# Patient Record
Sex: Male | Born: 1960 | Race: White | Hispanic: No | Marital: Married | Smoking: Never smoker
Health system: Southern US, Community
[De-identification: ages and names within clinical notes are randomized; demographics above are authoritative.]

## PROBLEM LIST (undated history)

## (undated) DIAGNOSIS — F419 Anxiety disorder, unspecified: Secondary | ICD-10-CM

## (undated) DIAGNOSIS — M549 Dorsalgia, unspecified: Secondary | ICD-10-CM

## (undated) DIAGNOSIS — M1990 Unspecified osteoarthritis, unspecified site: Secondary | ICD-10-CM

## (undated) DIAGNOSIS — T148XXA Other injury of unspecified body region, initial encounter: Secondary | ICD-10-CM

## (undated) DIAGNOSIS — G8929 Other chronic pain: Secondary | ICD-10-CM

## (undated) HISTORY — PX: KNEE ARTHROSCOPY W/ DEBRIDEMENT: SHX1867

---

## 1998-05-07 ENCOUNTER — Emergency Department (HOSPITAL_COMMUNITY): Admission: EM | Admit: 1998-05-07 | Discharge: 1998-05-07 | Payer: Self-pay | Admitting: Emergency Medicine

## 2002-04-06 ENCOUNTER — Encounter: Payer: Self-pay | Admitting: Emergency Medicine

## 2002-04-06 ENCOUNTER — Emergency Department (HOSPITAL_COMMUNITY): Admission: EM | Admit: 2002-04-06 | Discharge: 2002-04-06 | Payer: Self-pay | Admitting: Emergency Medicine

## 2003-12-15 ENCOUNTER — Emergency Department (HOSPITAL_COMMUNITY): Admission: EM | Admit: 2003-12-15 | Discharge: 2003-12-15 | Payer: Self-pay | Admitting: Emergency Medicine

## 2004-01-07 ENCOUNTER — Encounter: Admission: RE | Admit: 2004-01-07 | Discharge: 2004-01-07 | Payer: Self-pay | Admitting: Orthopedic Surgery

## 2004-01-16 ENCOUNTER — Encounter: Admission: RE | Admit: 2004-01-16 | Discharge: 2004-01-16 | Payer: Self-pay | Admitting: Sports Medicine

## 2004-01-18 ENCOUNTER — Emergency Department (HOSPITAL_COMMUNITY): Admission: EM | Admit: 2004-01-18 | Discharge: 2004-01-18 | Payer: Self-pay | Admitting: Family Medicine

## 2004-01-24 ENCOUNTER — Inpatient Hospital Stay (HOSPITAL_COMMUNITY): Admission: EM | Admit: 2004-01-24 | Discharge: 2004-01-26 | Payer: Self-pay | Admitting: Psychiatry

## 2004-01-24 ENCOUNTER — Ambulatory Visit: Payer: Self-pay | Admitting: Psychiatry

## 2004-02-09 ENCOUNTER — Emergency Department (HOSPITAL_COMMUNITY): Admission: EM | Admit: 2004-02-09 | Discharge: 2004-02-09 | Payer: Self-pay | Admitting: Family Medicine

## 2004-03-05 ENCOUNTER — Emergency Department (HOSPITAL_COMMUNITY): Admission: EM | Admit: 2004-03-05 | Discharge: 2004-03-06 | Payer: Self-pay | Admitting: Emergency Medicine

## 2004-03-06 ENCOUNTER — Ambulatory Visit: Payer: Self-pay | Admitting: Psychiatry

## 2004-03-06 ENCOUNTER — Inpatient Hospital Stay (HOSPITAL_COMMUNITY): Admission: EM | Admit: 2004-03-06 | Discharge: 2004-03-09 | Payer: Self-pay | Admitting: Psychiatry

## 2005-07-18 ENCOUNTER — Emergency Department (HOSPITAL_COMMUNITY): Admission: EM | Admit: 2005-07-18 | Discharge: 2005-07-18 | Payer: Self-pay | Admitting: Emergency Medicine

## 2006-01-28 ENCOUNTER — Emergency Department (HOSPITAL_COMMUNITY): Admission: EM | Admit: 2006-01-28 | Discharge: 2006-01-28 | Payer: Self-pay | Admitting: Emergency Medicine

## 2006-01-29 ENCOUNTER — Inpatient Hospital Stay (HOSPITAL_COMMUNITY): Admission: EM | Admit: 2006-01-29 | Discharge: 2006-01-31 | Payer: Self-pay | Admitting: Emergency Medicine

## 2006-01-29 ENCOUNTER — Ambulatory Visit: Payer: Self-pay | Admitting: *Deleted

## 2006-02-22 ENCOUNTER — Ambulatory Visit: Payer: Self-pay | Admitting: *Deleted

## 2006-03-03 ENCOUNTER — Emergency Department (HOSPITAL_COMMUNITY): Admission: EM | Admit: 2006-03-03 | Discharge: 2006-03-03 | Payer: Self-pay | Admitting: Emergency Medicine

## 2006-08-28 ENCOUNTER — Emergency Department (HOSPITAL_COMMUNITY): Admission: EM | Admit: 2006-08-28 | Discharge: 2006-08-28 | Payer: Self-pay | Admitting: Emergency Medicine

## 2008-07-08 ENCOUNTER — Encounter: Admission: RE | Admit: 2008-07-08 | Discharge: 2008-07-08 | Payer: Self-pay | Admitting: Family Medicine

## 2010-02-21 ENCOUNTER — Encounter: Payer: Self-pay | Admitting: Orthopedic Surgery

## 2010-02-22 ENCOUNTER — Encounter: Payer: Self-pay | Admitting: Emergency Medicine

## 2010-02-22 ENCOUNTER — Encounter: Payer: Self-pay | Admitting: Cardiovascular Disease

## 2010-06-19 NOTE — H&P (Signed)
NAME:  FALCON, MCCASKEY NO.:  [id number]   MEDICAL RECORD NO.:  [id number]          PATIENT TYPE:  INP   LOCATION:  3736                         FACILITY:  [facility]   PHYSICIAN:  Reginia Forts, MD     DATE OF BIRTH:  [birth year]   DATE OF ADMISSION:  01/29/2006  DATE OF DISCHARGE:  01/31/2006                              HISTORY & PHYSICAL   PHONE CALL NOTE   Patrick Cruz is a 50 year old Caucasian male with a history of depression,  lower extremity edema, and hypertension who calls regarding worsening  lower extremity edema and pain in his legs.  Patient notes that these  symptoms have existed since December 30 to December 31 when he was  discharged from the hospital.  He has noted improvement with Lasix and  has taken one dose today earlier.  He denies any chest pain, but notes  that he has a fair amount of pain in both knee joints associated with  the edema.  The patient has noted improvement with his one dose of  Lasix.  I advised the patient to take an extra dose and a half of Lasix  tonight with the caveat that if the symptoms continue to worsen that he  present to the nearest emergency room.  The patient understands and will  comply.  Pending the results, he will determine if further evaluation  may be required either tonight or in the morning through urgent care.      Reginia Forts, MD  Electronically Signed     RA/MEDQ  D:  02/25/2006  T:  02/26/2006  Job:  [id number]

## 2010-06-19 NOTE — Discharge Summary (Signed)
Patrick Cruz, Patrick Cruz NO.:  [id number]   MEDICAL RECORD NO.:  [id number]          PATIENT TYPE:  IPS   LOCATION:  0503                          [facility]ILITY[facility]  [facility]   PHYSICIAN:  Jasmine Pang, M.D. DATE OF BIRTH:  Jun 29, 1960   DATE OF ADMISSION:  03/06/2004  DATE OF DISCHARGE:  03/09/2004                                 DISCHARGE SUMMARY   IDENTIFICATION:  The patient is a 50 year old married Caucasian male  involuntarily committed on March 06, 2004 after an overdose.   HISTORY OF PRESENT ILLNESS:  The patient is here on petition.  Papers state  that he overdosed on Klonopin 1 mg, taking approximately 25 mg, and is  considered a danger to himself.  The patient states he only took 10 Klonopin  tablets.  He states that his stressors are that his wife drinks.  She is  always on him calling him names, making him feel worthless.  The patient  has been unemployed since his work injury.  He is hoping to return to work  soon.  He states that he is also caring for his neighbor who has cancer and  it is very upsetting to him.  He reports ongoing conflict with his wife as  indicated above. They were to have marital counseling but he states that his  wife was not willing to do it now.  This is the second psychiatric admission  for patient.  He was in our hospital in December of 2005 for depression.  He  sees Dr. Senaida Ores on an outpatient basis.  No history of suicide attempt.  The patient has no substance abuse history.  He is a nonsmoker.   PAST MEDICAL HISTORY:  No acute medical problems.  He was seen in  orthopedics due to knee pain at one time.   MEDICATIONS:  The patient is currently on Paxil 20 mg daily, Klonopin 1 mg  t.i.d. and Abilify 30 mg daily and Adderall 30 mg XL t.i.d.  He reports  compliance with his medications.   ALLERGIES:  He has no known drug allergies.   PHYSICAL EXAMINATION:  The patient was assessed at [facility] ED.  He was a  well-nourished,  well-developed, middle-aged man, very tearful.  Temperature  was 98.5, heart rate 69, respirations 22, blood pressure 129/66.  He is 6  feet tall and 240 pounds.  Blood sugar is 110.  Salicylate is less than 4.  Alcohol level is less than 5.  Urine drug screen is positive for opiates and  positive for benzodiazepines.  Pulse oximetry is 93.   LABORATORY DATA:  On admission, hemogram was within normal limits except for  a slightly elevated WBC at 10.9 (4-10.5).  Routine chemistry profile was  within normal limits except for a slightly decreased sodium at 134 (135-145)  and a slightly increased glucose at 109 (70-99).  TSH was within normal  limits.   HOSPITAL COURSE:  Upon admission, the patient was continued on his routine  medications.  This included Paxil 20 mg daily, Klonopin 0.5 mg p.o. b.i.d.  and 0.5 mg q.h.s.  Abilify was decreased to 15 mg daily.  He was given  hydrocodone 7.5/750 mg q.6h. p.r.n. pain, Ambien 10 mg q.h.s. p.r.n. was  ordered.  On March 07, 2004, his Paxil was increased to 40 mg q.d.  The  patient was cooperative and able to participate appropriately in the  hospital milieu.  He was able to get along well with peers and staff.  He  attended groups and appeared motivated to make some changes.  At the time of  discharge, the patient's mental status exam had improved markedly.  He was  less depressed.  He was not suicidal, not homicidal.  There was no  psychosis.  There was no psychosis or delusions.  Thoughts were logical and  goal directed.  The cognitive was grossly within normal limits.   DISCHARGE DIAGNOSES:   AXIS I:  Depressive disorder not otherwise specified.   AXIS II:  None.   AXIS III:  Knee pain.   AXIS IV:  Problems with primary support group, occupation, economics, other  psychosocial problems, medical problems.   AXIS V:  Current Global Assessment of Functioning 50; admission Global  Assessment of Functioning 30; estimated highest past year  65.   DISCHARGE MEDICATIONS:  1.  Paxil 40 mg daily.  2.  Abilify 15 mg daily.  3.  Klonopin 0.5 mg at 9 a.m. and 6 p.m. and 0.5 mg at bedtime.  4.  Hydrocodone 7.5/750 mg, take as directed per primary care M.D.   DIET:  There were no specific dietary limitations.   ACTIVITY:  Will be determined by his orthopedic doctor.   POST-HOSPITAL CARE PLANS:  The patient will see Abel Presto on February 14th  at 2 p.m.  He will return to see Dr. Leone Haven on February 16th at  [facility].      BHS/MEDQ  D:  04/16/2004  T:  04/17/2004  Job:  [id number]

## 2010-06-19 NOTE — Assessment & Plan Note (Signed)
[facility]                            CARDIOLOGY OFFICE NOTE   NAME:Patrick Cruz, Patrick Cruz                      MRN:          [id number]  DATE:02/22/2006                            DOB:          May 23, 1960    This is a 50 year old married white male patient who was in the hospital  from January 29, 2006 throughout January 31, 2006 with lower extremity  edema and hypertension as well as bronchitis.  He was given  antihypertensive medication and diuresed.  He was scheduled for an  outpatient Myoview and 2-D echo, but these were not done because his  wife was being evaluated for oral cancer and he had to cancel.  He  called today because of a 11 pound weight gain overnight, increase pain  in his lower extremities and high blood pressure.  He is quite anxious  in the office today.  His blood pressure is elevated.  He denies any  significant shortness of breath, dyspnea on excerption, orthopnea or  paroxysmal nocturnal dyspnea or chest pain.  He denies any excess of  salt intake, his wife does all the cooking, but he claims they are very  careful as far as salt, watching the salt.   CURRENT MEDICATIONS:  1. Lisinopril 10 mg daily.  2. Paxil 40 mg daily.  3. Klonopin 1 mg t.i.d.   PHYSICAL EXAMINATION:  This is an anxious 50 year old white male in no  acute distress.  Blood pressure 174/95, pulse 92, weight 259.  NECK:  Without JVD, HJR, bruit or thyroid enlargement.  LUNGS:  Clear anterior, posterior and lateral.  HEART:  Regular rate and rhythm at 90 beats per minute.  Normal S1 and  S2.  No murmur, rub, bruit, thrill or heave noted.  ABDOMEN:  Soft without organomegaly, masses or lesions or abnormal  tenderness.  EXTREMITIES:  Plus 2 edema bilaterally.  Decreased but present distal  pulses.   EKG normal sinus rhythm, no acute change.   IMPRESSION:  1. Hypertension, uncontrolled.  2. Edema with 11 pound weight gain per patient, 19 pound weight  gain      on our scales from hospital weight.  3. History of depression.  4. Bronchitis.   PLAN:  At this time I will increase his lisinopril to 20 mg a day and  add Lasix 40 mg daily in hopes of controlling his blood pressure and  writhing him of some of his edema.  We have scheduled a 2-D echo and  lower extremity Doppler to be done today as well as a BMET and D-dimer.  We have also scheduled him for a stress Myoview to be done since he  could not do this when it was scheduled for January 8.  He will see Dr.  Eden Cruz in follow up next week.   ADDENDUM  [name] was seen as add on today for lower extremity edema and at  least 11 pound weight gain.  I had scheduled him for blood work as well  as lower extremity Dopplers and 2D echo to be done today in our office,  and the  patient walked out without having any of this done.  Just stated  he was going home and left.  We are not sure why he did this.  I have  tried calling his home to ask him to return to have these tests  performed, but there is no answer.  He had a stress Myoview scheduled  once before that he did not show for.      Patrick Reedy, PA-C  Electronically Signed      Patrick Cranker, MD, [facility]  Electronically Signed   ML/MedQ  DD: 02/22/2006  DT: 02/22/2006  Job #: [id number]

## 2010-06-19 NOTE — H&P (Signed)
NAMEKEENEN, ROESSNER               ACCOUNT NO.:  [id number]   MEDICAL RECORD NO.:  [id number]          PATIENT TYPE:  INP   LOCATION:  3736                         FACILITY:  [facility]   PHYSICIAN:  Gerrit Friends. Dietrich Pates, MD, FACCDATE OF BIRTH:  06/09/1960   DATE OF ADMISSION:  01/29/2006  DATE OF DISCHARGE:                              HISTORY & PHYSICAL   CHIEF COMPLAINT:  Lower extremity swelling and chest pain for 4 days.   HISTORY OF PRESENT ILLNESS:  This is a 50 year old gentleman with a  history of depression and hypertension who states for the past 4 days he  has had increasing lower extremity edema along with intermittent chest  pain with exertion.  He has never had these symptoms in the past.  He  states that his chest pain is epigastric in nature and typically worse  with exertion; however, he does not endorse this strongly.  He denies  any shortness of breath, nausea, or vomiting with this chest discomfort  but does state that it radiates to his left arm and becomes profoundly  diaphoretic.  He states that it lasts for about 2 or 3 minutes and then  resolves spontaneously.  Many times he does not even have to lay down to  relax for it to resolve on its own.  He states that he has had some  minimal dyspnea on exertion for the last 4 days; however, he is more  limited in his functional capacity by leg tenderness secondary to  worsening lower extremity edema.  He states that he has not had any  orthopnea or PND.  He does not endorse abdominal swelling, and he has  never had symptoms like this before.  He denies any flu-like symptoms,  recent travel, and he states that his urine output has been normal over  the preceding period of time.  He states that currently he does have  some issues with depression given that his wife is still being treated  for oral cancer in [place].   PAST MEDICAL HISTORY:  1. Hypertension for a number of years, currently not on any      medications.  2. Depression for 2-3 years, a history of an overdose in February of      2006.  3. Status post left knee arthroscopy.   ALLERGIES:  NONE.   CURRENT MEDICATIONS:  1. Paxil 40 mg daily, although he is not sure of the dose 100%.  2. Klonopin 1 mg t.i.d.   SOCIAL HISTORY:  He lives in [place] with his wife.  He is a  Lobbyist.  He has 2 kids.  He denies any tobacco, alcohol,  or drug abuse.   FAMILY HISTORY:  Is significant for a father who is alive at age 31.  He  underwent a CABG when he was 53.  He has 3 sisters with no coronary  disease.   REVIEW OF SYSTEMS:  Is as above in the HPI, otherwise negative.   PHYSICAL EXAM:  VITAL SIGNS:  Temperature of 98.1, pulse of 78,  respiratory rate of 18, blood pressure of 131/78,  O2 saturation is 95%  on 2 L nasal cannula.  GENERAL:  He is in no acute distress.  HEENT EXAM:  Normocephalic, atraumatic.  Pupils equally round and  reactive to light.  Oropharynx is moist and clear.  His sclerae are  clear.  NECK:  Supple with no lymphadenopathy, 2+ carotid impulses, symmetrical  bilaterally.  He has no carotid bruits.  His JVP is to his ears and  somewhat obscured by his large neck.  CARDIOVASCULAR EXAM:  Regular rate and rhythm, normal S1, S2.  I cannot  hear an S3 or an S4 gallop.  There is no murmurs or rubs.  LUNGS:  Clear to auscultation bilaterally without any wheezes, rhonchi,  or rales.  ABDOMINAL EXAM:  Positive obesity, positive bowel sounds, soft,  nontender, nondistended, without a palpable liver or spleen.  EXTREMITY EXAM:  2+ radial and posterior tibials, pulses symmetric  bilaterally.  He has 2+ pretibial edema to his patellae, symmetric  bilaterally.  It might be slightly worse on the left.  There is some  overlying hemosiderin changes noted on the anterior aspect of both of  his legs.  They are tender to palpation.  SKIN:  He has no rashes or lesions otherwise on his body.  NEUROLOGIC EXAM:  Alert and  oriented x3.  His affect is somewhat flat.  Cranial nerves III through XII are intact.  He has 5/5 muscle strength  throughout.   LABORATORY DATA:  Chest x-ray is unremarkable without any effusions or  edema.  His cardiac silhouette looks unremarkable.  EKG shows a normal  sinus rhythm and a rate of 95 with a normal axis, normal intervals  without any ST or T wave changes or a Q wave.  White count is 5.7,  hematocrit of 35, platelets 277, creatinine of 1, potassium of 4.1,  bicarb of 22.  CK-MB is slightly elevated at 8.6, troponin-I of less  than 0.5, D-dimer is negative at less than 0.22.   ASSESSMENT:  1. Unstable angina.  Patient has already been started on aspirin and      heparin in the emergency department given his mildly elevated CK-MB      and negative troponin.  2. New-onset heart failure of unclear etiology.  3. Depression.   PLAN:  Patient will be admitted to Telemetry.  He will be ruled out for  an acute MI by continuous cycling of cardiac enzymes.  I am unclear what  the etiology of his heart failure symptoms are given that on exam he  does not appear to be hypertensive and his EKG does not have voltage  suggestive of long-standing uncontrolled hypertension.  We will check  PFTs as well as an echo in the morning.  If his EF is truly depressed,  it would be reasonable to look for secondary causes such as coronary  artery disease and other non-ischemic etiologies of his heart failure.  If he has any focal wall motion abnormalities or continued cardiac  enzyme abnormalities, it would be reasonable to proceed with a  catheterization just to rule out the possibility of occult coronary  artery disease causing these symptoms.  I have started him on a low-dose  ACE inhibitor and have held beta blocker.  Given his decompensated heart  failure symptoms, I have started him on his Lasix IV once daily.  This can be increased in frequency if he is not adequately diuresing.  At   this point in time, I have continued antidepressants.  I  have also  continued him on aspirin and heparin at this point in time, and, once  again, as mentioned before, we will check an echo in the morning.     ______________________________  Eston Esters. Sherryll Burger, MD      Gerrit Friends. Dietrich Pates, MD, [facility]  Electronically Signed    BRS/MEDQ  D:  01/29/2006  T:  01/30/2006  Job:  [id number]

## 2010-06-19 NOTE — H&P (Signed)
[name], [name] NO.:  [id number]   MEDICAL RECORD NO.:  [id number]          PATIENT TYPE:  IPS   LOCATION:  0503                          [facility]ILITY[facility]  [facility]   PHYSICIAN:  Jeanice Lim, M.D. DATE OF BIRTH:  12-04-1960   DATE OF ADMISSION:  03/06/2004  DATE OF DISCHARGE:                         PSYCHIATRIC ADMISSION ASSESSMENT   IDENTIFYING INFORMATION:  This is a 50 year old married white male,  involuntarily committed on March 06, 2004.   HISTORY OF PRESENT ILLNESS:  The patient is here on petition.  Papers state  that the patient overdosed on Klonopin 1 mg, taking approximately 25 mg and  is considered a danger to himself.  The patient states he only took 10  Klonopin tablets.  He states that his stressors are that his wife drinks.  She is always on him, calling him names, making him feel not very  worthless.  The patient has been unemployed since his work injury in October  2005.  He is hoping to return to work soon.  He states he is also caring for  his neighbor who has cancer and it is very upsetting to him and he does not  know what to do any more.  He reports ongoing conflict with the wife.  They  were to have marital counseling but wife, he states. Is not willing to do  that at this time.   PAST PSYCHIATRIC HISTORY:  Second admission.  The patient was here in  December 2005 for depression.  Sees Dr. Senaida Ores as an outpatient.  No  history of suicide attempt.   SOCIAL HISTORY:  He is a 50 year old married white male, married for 23  years, first marriage, has 2 children ages 74 and 65.  Lives with his wife  and children.  Unemployed due to knee surgery in October 2005.  He is an  Journalist, newspaper.  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   FAMILY HISTORY:  None.   ALCOHOL DRUG HISTORY:  Non smoker, denies any alcohol or drug use.   PAST MEDICAL HISTORY:  The patient was seen in orthopedics, possibly Dr.  Carlene Coria.  Medical problems are knee pain.   MEDICATIONS:   The patient has been on Paxil 20 mg daily, Klonopin t.i.d.,  Abilify 30 mg daily, Adderall 30 mg XL t.i.d.  The patient reports  compliance with his medications.   DRUG ALLERGIES:  No known allergies.   PHYSICAL EXAMINATION:  The patient was assessed at [facility]  He is a well-  nourished, well-developed middle-aged male, very tearful.  Temperature is  98.5, 69 heart rate, 22 respirations, blood pressure is 129/66.  Six feet  tall, 240 pounds.  Blood sugar is 110, salicylate is less than 4, alcohol  level is less than 5.  Urine drug screen is positive for opiates, positive  for benzodiazepines.  Pulse oximetry is 93.   MENTAL STATUS EXAM:  Alert, middle-aged, cooperative male, little eye  contact.  Speech is clear.  The patient feels hopeless and helpless.  Affect  is tearful throughout the interview, very despondent, looking down most of  the time.  Thought  processes are coherent, there is no evidence of  psychosis, cognition function is intact.  Memory is good, judgment and  insight is fair.   ADMISSION DIAGNOSES:   AXIS I:  Depressive disorder not otherwise specified.   AXIS II:  Deferred.   AXIS III:  Knee pain.   AXIS IV:  Problems with primary support group, occupation, economics, other  psychosocial problems, medical problems.   AXIS V:  Current is 30, estimated this past year is 43.   PLAN:  Stabilize mood and thinking, contract for safety.  We will hold the  patient's Adderall for now as that may be adding to the patient's mood  instability.  We will also decrease Klonopin.  We will resume his Paxil.  We  will have a family session with wife, consider family therapy, and Alanon.   TENTATIVE LENGTH OF CARE:  4-5 days.      JO/MEDQ  D:  03/06/2004  T:  03/06/2004  Job:  [id number]

## 2010-06-19 NOTE — Discharge Summary (Signed)
NAMEOBERT, Patrick Cruz               ACCOUNT NO.:  [id number]   MEDICAL RECORD NO.:  [id number]          PATIENT TYPE:  INP   LOCATION:  3736                         FACILITY:  [facility]   PHYSICIAN:  Noralyn Pick. Eden Emms, MD, FACCDATE OF BIRTH:  1960-07-23   DATE OF ADMISSION:  01/29/2006  DATE OF DISCHARGE:  01/31/2006                               DISCHARGE SUMMARY   PRIMARY CARDIOLOGIST:  Theron Arista C. Eden Emms, MD, [facility].   PRIMARY CARE PHYSICIAN:  None listed.   PRIMARY DIAGNOSIS:  Unstable angina.   SECONDARY DIAGNOSES:  1. Bronchitis.  2. Depression.  3. History of lower extremity edema.  4. History of hypertension.   PROCEDURES PERFORMED DURING HOSPITALIZATION:  Echocardiogram.   HISTORY OF PRESENT ILLNESS:  This is a 50 year old Caucasian male with  no prior cardiac history who was admitted through the emergency room  with complaints of lower extremity edema x4 days with negative orthopnea  or PND.  He also had some epigastric chest pain without associated  nausea, vomiting or diaphoresis.  He has had some mild dyspnea on  exertion for the last four days with swelling in his lower extremities.  He was admitted for further evaluation to rule out CHF and rule out ACS.   HOSPITAL COURSE:  The patient was admitted, diuresed, although BNP was  only 92.  The patient was afebrile and chest x-ray revealed right upper  lobe atelectasis or infiltrate with followup CT of the chest revealing  patchy, predominantly upper lobe ground glass air space opacity for  which the differential diagnosis is broad and includes alveolar filling  process such as blood, pus, fluids, cells and protein.  Clinical  correlation recommended.  The patient's EKG revealed normal sinus rhythm  with nonspecific T-wave abnormality and the patient was observed further  throughout hospitalization with serial enzymes.  The patient's troponins  were negative x3 during hospitalization.  The patient's dyspnea did  decrease  with improvement in breathing status after use of Lasix.  There  was no evidence for PE.  The patient's white blood cells were also not  elevated during hospitalization and he was afebrile.  The patient had no  further chest discomfort during hospitalization.  He was seen and  examined on the day of discharge by Dr. Eden Emms and deemed stable for  discharge.   LABS ON DISCHARGE:  Sodium 139, potassium 3.9, chloride 102, CO2 27,  glucose 120, BUN 11, creatinine 1, d-dimer less than 0.22, hemoglobin  14.5, hematocrit 42.8, white blood cells 13.3, platelets 319.   VITAL SIGNS ON DISCHARGE:  Blood pressure 162/92, pulse 83, respirations  22, temperature 98.  The patient weighed 240.2 pounds.   FOLLOWUP APPOINTMENTS AND PLANS:  1. The patient is scheduled for an outpatient Myoview stress test on      January 8 at 10 a.m. at [facility].  2. The patient is scheduled for followup appointment with Dr. Eden Emms      on January 17 at 10:45 a.m.  3. The patient has been advised on smoking cessation.   DISCHARGE MEDICATIONS:  1. Avalox 400 mg once a  day x5 days.  2. Lisinopril 10 mg once a day.  3. Percocet 1-2 tablets as needed p.r.n. pain.  4. Paxil 40 mg daily.  5. Klonopin 1 mg three times a day.   ALLERGIES:  NO KNOWN DRUG ALLERGIES.   TIME SPENT WITH PATIENT:  Including physician time:  30 minutes.      Bettey Mare. Lyman Bishop, NP      Noralyn Pick. Eden Emms, MD, [facility]  Electronically Signed    KML/MEDQ  D:  01/31/2006  T:  02/01/2006  Job:  [id number]
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
## 2018-01-10 ENCOUNTER — Emergency Department (HOSPITAL_COMMUNITY): Payer: Medicare Other

## 2018-01-10 ENCOUNTER — Encounter (HOSPITAL_COMMUNITY): Payer: Self-pay | Admitting: *Deleted

## 2018-01-10 ENCOUNTER — Emergency Department (HOSPITAL_COMMUNITY)
Admission: EM | Admit: 2018-01-10 | Discharge: 2018-01-10 | Disposition: A | Discharge status: Home / Self Care | Payer: Medicare Other | Attending: Emergency Medicine | Admitting: Emergency Medicine

## 2018-01-10 DIAGNOSIS — I10 Essential (primary) hypertension: Secondary | ICD-10-CM | POA: Insufficient documentation

## 2018-01-10 DIAGNOSIS — M545 Low back pain, unspecified: Secondary | ICD-10-CM

## 2018-01-10 HISTORY — DX: Other chronic pain: G89.29

## 2018-01-10 HISTORY — DX: Unspecified osteoarthritis, unspecified site: M19.90

## 2018-01-10 HISTORY — DX: Dorsalgia, unspecified: M54.9

## 2018-01-10 LAB — I-STAT CHEM 8, ED
BUN: 15 mg/dL (ref 6–20)
Calcium, Ion: 1.16 mmol/L (ref 1.15–1.40)
Chloride: 102 mmol/L (ref 98–111)
Creatinine, Ser: 1.1 mg/dL (ref 0.61–1.24)
Glucose, Bld: 124 mg/dL — ABNORMAL HIGH (ref 70–99)
HCT: 39 % (ref 39.0–52.0)
Hemoglobin: 13.3 g/dL (ref 13.0–17.0)
Potassium: 4.1 mmol/L (ref 3.5–5.1)
Sodium: 137 mmol/L (ref 135–145)
TCO2: 27 mmol/L (ref 22–32)

## 2018-01-10 MED ORDER — OXYCODONE-ACETAMINOPHEN 5-325 MG PO TABS
1.0000 | ORAL_TABLET | Freq: Once | ORAL | Status: AC
  Administered 2018-01-10: 1 via ORAL
  Filled 2018-01-10: qty 1

## 2018-01-10 MED ORDER — HYDROCHLOROTHIAZIDE 12.5 MG PO TABS: 12.5000 mg | ORAL_TABLET | Freq: Every day | ORAL | 0 refills | Status: DC

## 2018-01-10 NOTE — ED Notes (Signed)
Patient transported to X-ray 

## 2018-01-10 NOTE — ED Notes (Signed)
Patient verbalizes understanding of discharge instructions. Opportunity for questioning and answers were provided. Armband removed by staff, pt discharged from ED ambulatory to home.  

## 2018-01-10 NOTE — ED Triage Notes (Signed)
Pt in c/o lower back pain that radiates down his right leg, has been going on for years but got worse last night

## 2018-01-10 NOTE — ED Provider Notes (Signed)
[place] EMERGENCY DEPARTMENT Provider Note   CSN: [id number] Arrival date & time: 01/10/18  1134   History   Chief Complaint Chief Complaint  Patient presents with  . Back Pain    HPI Patrick Cruz is a 57 y.o. male.   HPI    57 year old male presents today with complaints of low back pain.  Patient notes history of chronic low back pain that waxes and wanes.  He has been evaluated several times for back pain and lower extremity swelling.  Notes that today's pain is similar to previous pain and is experiencing a flare.  Denies any abdominal pain or changes in distal neurovascular status.  Patient does note he has chronic decreased sensation through his bilateral lower legs but this is unchanged.  He denies any significant weakness in his lower extremities denies any change in bowel or bladder control.  Patient denies any fever.  No trauma to his low back.  Patient has not tried any medications for this.  He notes he has used tramadol in the past.   Past Medical History:  Diagnosis Date  . Arthritis   . Chronic back pain     There are no active problems to display for this patient.   History reviewed. No pertinent surgical history.      Home Medications    Prior to Admission medications   Medication Sig Start Date End Date Taking? Authorizing Provider  hydrochlorothiazide (HYDRODIURIL) 12.5 MG tablet Take 1 tablet (12.5 mg total) by mouth daily. 01/10/18   Okey Regal, PA-C    Family History History reviewed. No pertinent family history.  Social History Social History   Tobacco Use  . Smoking status: Never Smoker  . Smokeless tobacco: Never Used  Substance Use Topics  . Alcohol use: Not on file  . Drug use: Not on file     Allergies   Patient has no known allergies.   Review of Systems Review of Systems  All other systems reviewed and are negative.   Physical Exam Updated Vital Signs BP (!) 181/94 (BP Location: Right  Arm)   Pulse 98   Temp 98 F (36.7 C) (Oral)   Resp 20   SpO2 100%   Physical Exam  Constitutional: He is oriented to person, place, and time. He appears well-developed and well-nourished.  HENT:  Head: Normocephalic and atraumatic.  Eyes: Pupils are equal, round, and reactive to light. Conjunctivae are normal. Right eye exhibits no discharge. Left eye exhibits no discharge. No scleral icterus.  Neck: Normal range of motion. No JVD present. No tracheal deviation present.  Pulmonary/Chest: Effort normal. No stridor.  Musculoskeletal:  1+ pitting edema in the bilateral lower extremities-decreased sensation throughout bilateral lower extremities closely, bilateral strength 5 out of 5  No cervical or thoracic midline tenderness, minor tenderness palpation of the lower lumbar and surrounding musculature  Neurological: He is alert and oriented to person, place, and time. Coordination normal.  Psychiatric: He has a normal mood and affect. His behavior is normal. Judgment and thought content normal.  Nursing note and vitals reviewed.    ED Treatments / Results  Labs (all labs ordered are listed, but only abnormal results are displayed) Labs Reviewed  I-STAT CHEM 8, ED - Abnormal; Notable for the following components:      Result Value   Glucose, Bld 124 (*)    All other components within normal limits    EKG None  Radiology Dg Lumbar Spine Complete  Result  Date: 01/10/2018 CLINICAL DATA:  Central low back pain radiating down both legs EXAM: LUMBAR SPINE - COMPLETE 4+ VIEW COMPARISON:  08/28/2006 FINDINGS: L5 probable pars defects.  There is chronic grade 2 anterolisthesis. Advanced degenerative disc narrowing with vacuum phenomenon at L4-5 and L5-S1, new/progressed. L3 superior endplate fracture with depression, favored chronic given sclerosis, spurring, and lack of a discrete fracture line. Levoscoliosis and generalized right-sided endplate spurring. IMPRESSION: 1. Advanced  degenerative disease with levoscoliosis that has developed from 2008. 2. L5 pars defects with chronic grade 1 anterolisthesis at L5-S1. 3. L3 superior endplate fracture that is favored chronic. Electronically Signed   By: Monte Fantasia M.D.   On: 01/10/2018 13:13    Procedures Procedures (including critical care time)  Medications Ordered in ED Medications  oxyCODONE-acetaminophen (PERCOCET/ROXICET) 5-325 MG per tablet 1 tablet (1 tablet Oral Given 01/10/18 1226)     Initial Impression / Assessment and Plan / ED Course  I have reviewed the triage vital signs and the nursing notes.  Pertinent labs & imaging results that were available during my care of the patient were reviewed by me and considered in my medical decision making (see chart for details).     Labs: I-STAT Chem-8  Imaging: DG lumbar spine complete  Consults:  Therapeutics: Percocet  Discharge Meds: Hydrochlorothiazide  Assessment/Plan: 57 year old male presents today with chronic back pain.  I believe this is acute on chronic.  He was given pain medicine here that did not improve his symptoms.  Patient would not like any medication for home he is anticipating following up with pain management specialist which I find reasonable in this situation.  Patient has no red flags or need for further evaluation or management in the setting.  He was hypertensive here, he notes that normally his blood pressure runs in the 120s, he denies any complaints related to this.  Patient will be discharged with blood pressure medication, he will monitor there is at home and follow-up with primary care if blood pressure remains elevated.  He is given strict return precautions, he verbalized understanding and agreement to today's plan.      Final Clinical Impressions(s) / ED Diagnoses   Final diagnoses:  Hypertension, unspecified type  Low back pain without sciatica, unspecified back pain laterality, unspecified chronicity    ED  Discharge Orders         Ordered    hydrochlorothiazide (HYDRODIURIL) 12.5 MG tablet  Daily     01/10/18 1403           Okey Regal, PA-C 01/10/18 1404    Daleen Bo, MD 01/11/18 2118

## 2018-01-10 NOTE — Discharge Instructions (Addendum)
Please read attached information. If you experience any new or worsening signs or symptoms please return to the emergency room for evaluation. Please follow-up with your primary care provider  and pain management specialist as discussed.

## 2018-01-11 ENCOUNTER — Emergency Department (HOSPITAL_COMMUNITY)
Admission: EM | Admit: 2018-01-11 | Discharge: 2018-01-12 | Disposition: A | Discharge status: Home / Self Care | Payer: Medicare Other | Attending: Emergency Medicine | Admitting: Emergency Medicine

## 2018-01-11 ENCOUNTER — Other Ambulatory Visit: Payer: Self-pay

## 2018-01-11 ENCOUNTER — Encounter (HOSPITAL_COMMUNITY): Payer: Self-pay | Admitting: Emergency Medicine

## 2018-01-11 DIAGNOSIS — M5441 Lumbago with sciatica, right side: Secondary | ICD-10-CM | POA: Diagnosis not present

## 2018-01-11 DIAGNOSIS — M545 Low back pain: Secondary | ICD-10-CM | POA: Diagnosis present

## 2018-01-11 DIAGNOSIS — M5442 Lumbago with sciatica, left side: Secondary | ICD-10-CM | POA: Diagnosis not present

## 2018-01-11 NOTE — ED Triage Notes (Signed)
Pt c/o lower back pain that radiates down his right leg, seen yesterday for same. Hx chronic back pain.

## 2018-01-12 ENCOUNTER — Emergency Department (HOSPITAL_COMMUNITY): Payer: Medicare Other

## 2018-01-12 LAB — BASIC METABOLIC PANEL
Anion gap: 11 (ref 5–15)
BUN: 19 mg/dL (ref 6–20)
CO2: 25 mmol/L (ref 22–32)
Calcium: 8.9 mg/dL (ref 8.9–10.3)
Chloride: 101 mmol/L (ref 98–111)
Creatinine, Ser: 1.38 mg/dL — ABNORMAL HIGH (ref 0.61–1.24)
GFR calc Af Amer: >60 mL/min (ref >60)
GFR calc non Af Amer: 56 mL/min — ABNORMAL LOW (ref >60)
Glucose, Bld: 107 mg/dL — ABNORMAL HIGH (ref 70–99)
Potassium: 3.9 mmol/L (ref 3.5–5.1)
Sodium: 137 mmol/L (ref 135–145)

## 2018-01-12 LAB — CBC
HCT: 37.4 % — ABNORMAL LOW (ref 39.0–52.0)
Hemoglobin: 11.6 g/dL — ABNORMAL LOW (ref 13.0–17.0)
MCH: 27.1 pg (ref 26.0–34.0)
MCHC: 31 g/dL (ref 30.0–36.0)
MCV: 87.4 fL (ref 80.0–100.0)
Platelets: 321 10*3/uL (ref 150–400)
RBC: 4.28 MIL/uL (ref 4.22–5.81)
RDW: 13.1 % (ref 11.5–15.5)
WBC: 13.1 10*3/uL — ABNORMAL HIGH (ref 4.0–10.5)
nRBC: 0 % (ref 0.0–0.2)

## 2018-01-12 MED ORDER — IBUPROFEN 800 MG PO TABS: 800.0000 mg | ORAL_TABLET | Freq: Three times a day (TID) | ORAL | 0 refills | Status: DC | PRN

## 2018-01-12 MED ORDER — METHYLPREDNISOLONE SODIUM SUCC 125 MG IJ SOLR
INTRAMUSCULAR | Status: AC
  Administered 2018-01-12: 125 mg
  Filled 2018-01-12: qty 2

## 2018-01-12 MED ORDER — ONDANSETRON 4 MG PO TBDP: 4.0000 mg | ORAL_TABLET | Freq: Four times a day (QID) | ORAL | 0 refills | Status: DC | PRN

## 2018-01-12 MED ORDER — PREDNISONE 10 MG (21) PO TBPK: ORAL_TABLET | ORAL | 0 refills | Status: DC

## 2018-01-12 MED ORDER — OXYCODONE-ACETAMINOPHEN 5-325 MG PO TABS: 2.0000 | ORAL_TABLET | Freq: Four times a day (QID) | ORAL | 0 refills | Status: DC | PRN

## 2018-01-12 MED ORDER — ONDANSETRON HCL 4 MG/2ML IJ SOLN
INTRAMUSCULAR | Status: AC
  Administered 2018-01-12: 4 mg
  Filled 2018-01-12: qty 2

## 2018-01-12 MED ORDER — HYDROMORPHONE HCL 1 MG/ML IJ SOLN
INTRAMUSCULAR | Status: AC
  Administered 2018-01-12: 1 mg
  Filled 2018-01-12: qty 1

## 2018-01-12 MED ORDER — HYDROMORPHONE HCL 1 MG/ML IJ SOLN
1.0000 mg | Freq: Once | INTRAMUSCULAR | Status: AC
Start: 2018-01-12 — End: 2018-01-12
  Administered 2018-01-12: 1 mg via INTRAVENOUS
  Filled 2018-01-12: qty 1

## 2018-01-12 NOTE — ED Provider Notes (Addendum)
CHIEF COMPLAINT: Back pain  HPI: Patient is a 57 year old male who with history of chronic back pain who presents to the emergency department with an exacerbation of his chronic pain.  Describes it as pain in the lower back that radiates down both legs worse on the right side.  He has had numbness in both legs for several months and feels like both of his legs are weak but is able to ambulate.  He tells me that he is having urinary incontinence for the past couple of weeks.  No rectal incontinence.  No fever.  No history of cancer, HIV, diabetes.  No history of previous back surgery or injections.  Was just seen here in the emergency department for the same and had unremarkable x-rays.  States symptoms have been worse since yesterday and that is why he came back to the emergency department.  Does not have pain medication at home.  Has not tried over-the-counter Tylenol or ibuprofen.  ROS: See HPI Constitutional: no fever  Eyes: no drainage  ENT: no runny nose   Cardiovascular:  no chest pain  Resp: no SOB  GI: no vomiting GU: no dysuria Integumentary: no rash  Allergy: no hives  Musculoskeletal: no leg swelling  Neurological: no slurred speech ROS otherwise negative  PAST MEDICAL HISTORY/PAST SURGICAL HISTORY:  Past Medical History:  Diagnosis Date  . Arthritis   . Chronic back pain     MEDICATIONS:  Prior to Admission medications   Medication Sig Start Date End Date Taking? Authorizing Provider  hydrochlorothiazide (HYDRODIURIL) 12.5 MG tablet Take 1 tablet (12.5 mg total) by mouth daily. 01/10/18   Okey Regal, PA-C    ALLERGIES:  No Known Allergies  SOCIAL HISTORY:  Social History   Tobacco Use  . Smoking status: Never Smoker  . Smokeless tobacco: Never Used  Substance Use Topics  . Alcohol use: Not on file    FAMILY HISTORY: No family history on file.  EXAM: BP 106/63 (BP Location: Right Arm)   Pulse 72   Temp 98 F (36.7 C) (Oral)   Resp 18   SpO2 100%   CONSTITUTIONAL: Alert and oriented and responds appropriately to questions. Well-appearing; well-nourished HEAD: Normocephalic EYES: Conjunctivae clear, pupils appear equal, EOMI ENT: normal nose; moist mucous membranes NECK: Supple, no meningismus, no nuchal rigidity, no LAD  CARD: RRR; S1 and S2 appreciated; no murmurs, no clicks, no rubs, no gallops RESP: Normal chest excursion without splinting or tachypnea; breath sounds clear and equal bilaterally; no wheezes, no rhonchi, no rales, no hypoxia or respiratory distress, speaking full sentences ABD/GI: Normal bowel sounds; non-distended; soft, non-tender, no rebound, no guarding, no peritoneal signs, no hepatosplenomegaly BACK:  The back appears normal and is under over the lower lumbar spine without step-off or deformity, no redness or warmth, no ecchymosis or swelling, there is no CVA tenderness EXT: Normal ROM in all joints; non-tender to palpation; no edema; normal capillary refill; no cyanosis, no calf tenderness or swelling    SKIN: Normal color for age and race; warm; no rash NEURO: Moves all extremities equally, diminished strength in bilateral lower extremities, normal strength in bilateral upper extremities, diminished reflexes in bilateral lower extremities, reports diminished sensation in bilateral lower extremities with normal sensation in his upper extremities and face, normal speech, cranial nerves II through XII intact, able to ambulate PSYCH: The patient's mood and manner are appropriate. Grooming and personal hygiene are appropriate.  MEDICAL DECISION MAKING: Patient here with exacerbation of his chronic pain.  Now complaining of urinary incontinence.  Will obtain MRI of the lumbar spine without contrast to rule out cauda equina.  Will treat symptomatically with Dilaudid, Solu-Medrol.  No fevers to suggest epidural abscess, discitis or osteomyelitis.  He is able to ambulate.  I have witnessed him ambulating in the ED.  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  No signs of cauda equina.  Suspect the foraminal stenosis is likely the cause of most of his pain, numbness in both legs and radicular symptoms.  Will discharge with a steroid taper, Percocet, ibuprofen, Zofran.  Recommended outpatient follow-up.  Does not need emergent neurosurgical evaluation.    At this time, I do not feel there is any life-threatening condition present. I have reviewed and discussed all results (EKG, imaging, lab, urine as appropriate) and exam findings with patient/family. I have reviewed nursing notes and appropriate previous records.  I feel the patient is safe to be discharged home without further emergent workup and can continue workup as an outpatient as needed. Discussed usual and customary return precautions. Patient/family verbalize understanding and are comfortable with this plan.  Outpatient follow-up has been provided as needed. All questions have been answered.      Bush Murdoch, Delice Bison, DO 01/12/18 0618    Hulda Reddix, Delice Bison, DO 01/12/18 [id number]

## 2018-01-12 NOTE — ED Notes (Signed)
PT states understanding of care given, follow up care, and medication prescribed. PT ambulated from ED to car with a steady gait. 

## 2018-01-12 NOTE — Discharge Instructions (Addendum)
Your MRI did not show any emergent pathology today.  You need to follow-up with your outpatient primary care physician for your chronic pain.   Steps to find a Primary Care Provider (PCP):  Call [phone] or [phone] to access "[facility]."  2.  You may also go on the [facility] website at [email or web address]  3.  [facility] also frequently accepts new patients.  Patrick Cruz [phone]  4.  There are also multiple [facility], [facility] and [facility] practices throughout the [place] that are frequently accepting new patients. You may find a clinic that is close to your home and contact them.  [email or web address] [phone]  [facility] [email or web address]  [email or web address] [place] [email or web address] [phone]  5.  Local Health Departments also can provide primary care services.  [facility]  [place] [address] [phone]  [facility] [place] [address] [place] [facility] [place] [phone]

## 2018-02-21 ENCOUNTER — Ambulatory Visit: Payer: Medicare Other | Admitting: Nurse Practitioner

## 2018-05-08 DIAGNOSIS — M5442 Lumbago with sciatica, left side: Secondary | ICD-10-CM | POA: Diagnosis not present

## 2018-05-08 DIAGNOSIS — Z136 Encounter for screening for cardiovascular disorders: Secondary | ICD-10-CM | POA: Diagnosis not present

## 2018-05-08 DIAGNOSIS — M5441 Lumbago with sciatica, right side: Secondary | ICD-10-CM | POA: Diagnosis not present

## 2018-05-08 DIAGNOSIS — M5136 Other intervertebral disc degeneration, lumbar region: Secondary | ICD-10-CM | POA: Diagnosis not present

## 2018-05-08 DIAGNOSIS — G8929 Other chronic pain: Secondary | ICD-10-CM | POA: Diagnosis not present

## 2018-05-08 DIAGNOSIS — Z13220 Encounter for screening for lipoid disorders: Secondary | ICD-10-CM | POA: Diagnosis not present

## 2018-05-08 DIAGNOSIS — I10 Essential (primary) hypertension: Secondary | ICD-10-CM | POA: Diagnosis not present

## 2018-05-12 DIAGNOSIS — R69 Illness, unspecified: Secondary | ICD-10-CM | POA: Diagnosis not present

## 2018-06-05 DIAGNOSIS — R69 Illness, unspecified: Secondary | ICD-10-CM | POA: Diagnosis not present

## 2018-06-06 DIAGNOSIS — M4856XG Collapsed vertebra, not elsewhere classified, lumbar region, subsequent encounter for fracture with delayed healing: Secondary | ICD-10-CM | POA: Diagnosis not present

## 2018-06-06 DIAGNOSIS — F1120 Opioid dependence, uncomplicated: Secondary | ICD-10-CM | POA: Diagnosis not present

## 2018-06-06 DIAGNOSIS — M545 Low back pain: Secondary | ICD-10-CM | POA: Diagnosis not present

## 2018-06-06 DIAGNOSIS — G8929 Other chronic pain: Secondary | ICD-10-CM | POA: Diagnosis not present

## 2018-06-06 DIAGNOSIS — Z79891 Long term (current) use of opiate analgesic: Secondary | ICD-10-CM | POA: Diagnosis not present

## 2018-06-06 DIAGNOSIS — M4306 Spondylolysis, lumbar region: Secondary | ICD-10-CM | POA: Diagnosis not present

## 2018-06-06 DIAGNOSIS — G894 Chronic pain syndrome: Secondary | ICD-10-CM | POA: Diagnosis not present

## 2018-06-08 ENCOUNTER — Other Ambulatory Visit: Payer: Self-pay

## 2018-06-08 ENCOUNTER — Inpatient Hospital Stay (HOSPITAL_COMMUNITY)
Admission: EM | Admit: 2018-06-08 | Discharge: 2018-06-11 | DRG: 897 | Disposition: A | Discharge status: Home / Self Care | Payer: Medicare HMO | Attending: Internal Medicine | Admitting: Internal Medicine

## 2018-06-08 ENCOUNTER — Encounter (HOSPITAL_COMMUNITY): Payer: Self-pay | Admitting: Emergency Medicine

## 2018-06-08 DIAGNOSIS — M4807 Spinal stenosis, lumbosacral region: Secondary | ICD-10-CM | POA: Diagnosis not present

## 2018-06-08 DIAGNOSIS — F1123 Opioid dependence with withdrawal: Secondary | ICD-10-CM | POA: Diagnosis not present

## 2018-06-08 DIAGNOSIS — M4726 Other spondylosis with radiculopathy, lumbar region: Secondary | ICD-10-CM | POA: Diagnosis present

## 2018-06-08 DIAGNOSIS — I160 Hypertensive urgency: Secondary | ICD-10-CM

## 2018-06-08 DIAGNOSIS — G8929 Other chronic pain: Secondary | ICD-10-CM | POA: Diagnosis not present

## 2018-06-08 DIAGNOSIS — D649 Anemia, unspecified: Secondary | ICD-10-CM | POA: Diagnosis not present

## 2018-06-08 DIAGNOSIS — Z20828 Contact with and (suspected) exposure to other viral communicable diseases: Secondary | ICD-10-CM | POA: Diagnosis not present

## 2018-06-08 DIAGNOSIS — M4317 Spondylolisthesis, lumbosacral region: Secondary | ICD-10-CM | POA: Diagnosis not present

## 2018-06-08 DIAGNOSIS — M1990 Unspecified osteoarthritis, unspecified site: Secondary | ICD-10-CM | POA: Diagnosis present

## 2018-06-08 DIAGNOSIS — F1120 Opioid dependence, uncomplicated: Secondary | ICD-10-CM | POA: Diagnosis not present

## 2018-06-08 DIAGNOSIS — Z79891 Long term (current) use of opiate analgesic: Secondary | ICD-10-CM | POA: Diagnosis not present

## 2018-06-08 DIAGNOSIS — I4581 Long QT syndrome: Secondary | ICD-10-CM | POA: Diagnosis not present

## 2018-06-08 DIAGNOSIS — N179 Acute kidney failure, unspecified: Secondary | ICD-10-CM | POA: Diagnosis not present

## 2018-06-08 DIAGNOSIS — F1193 Opioid use, unspecified with withdrawal: Secondary | ICD-10-CM

## 2018-06-08 DIAGNOSIS — R69 Illness, unspecified: Secondary | ICD-10-CM | POA: Diagnosis not present

## 2018-06-08 DIAGNOSIS — R945 Abnormal results of liver function studies: Secondary | ICD-10-CM | POA: Diagnosis not present

## 2018-06-08 DIAGNOSIS — G894 Chronic pain syndrome: Secondary | ICD-10-CM | POA: Diagnosis not present

## 2018-06-08 DIAGNOSIS — Z03818 Encounter for observation for suspected exposure to other biological agents ruled out: Secondary | ICD-10-CM | POA: Diagnosis not present

## 2018-06-08 DIAGNOSIS — I10 Essential (primary) hypertension: Secondary | ICD-10-CM | POA: Diagnosis present

## 2018-06-08 DIAGNOSIS — M5416 Radiculopathy, lumbar region: Secondary | ICD-10-CM | POA: Diagnosis present

## 2018-06-08 LAB — RAPID URINE DRUG SCREEN, HOSP PERFORMED
Amphetamines: NOT DETECTED
Barbiturates: NOT DETECTED
Benzodiazepines: NOT DETECTED
Cocaine: NOT DETECTED
Opiates: NOT DETECTED
Tetrahydrocannabinol: NOT DETECTED

## 2018-06-08 LAB — CBC WITH DIFFERENTIAL/PLATELET
Abs Immature Granulocytes: 0.1 10*3/uL — ABNORMAL HIGH (ref 0.00–0.07)
Basophils Absolute: 0.1 10*3/uL (ref 0.0–0.1)
Basophils Relative: 0 %
Eosinophils Absolute: 0.1 10*3/uL (ref 0.0–0.5)
Eosinophils Relative: 1 %
HCT: 39.6 % (ref 39.0–52.0)
Hemoglobin: 12.3 g/dL — ABNORMAL LOW (ref 13.0–17.0)
Immature Granulocytes: 1 %
Lymphocytes Relative: 29 %
Lymphs Abs: 3.3 10*3/uL (ref 0.7–4.0)
MCH: 26.7 pg (ref 26.0–34.0)
MCHC: 31.1 g/dL (ref 30.0–36.0)
MCV: 86.1 fL (ref 80.0–100.0)
Monocytes Absolute: 0.7 10*3/uL (ref 0.1–1.0)
Monocytes Relative: 6 %
Neutro Abs: 7.1 10*3/uL (ref 1.7–7.7)
Neutrophils Relative %: 63 %
Platelets: 372 10*3/uL (ref 150–400)
RBC: 4.6 MIL/uL (ref 4.22–5.81)
RDW: 13.9 % (ref 11.5–15.5)
WBC: 11.3 10*3/uL — ABNORMAL HIGH (ref 4.0–10.5)
nRBC: 0 % (ref 0.0–0.2)

## 2018-06-08 LAB — COMPREHENSIVE METABOLIC PANEL
ALT: 84 U/L — ABNORMAL HIGH (ref 0–44)
AST: 125 U/L — ABNORMAL HIGH (ref 15–41)
Albumin: 4 g/dL (ref 3.5–5.0)
Alkaline Phosphatase: 205 U/L — ABNORMAL HIGH (ref 38–126)
Anion gap: 12 (ref 5–15)
BUN: 24 mg/dL — ABNORMAL HIGH (ref 6–20)
CO2: 24 mmol/L (ref 22–32)
Calcium: 9.1 mg/dL (ref 8.9–10.3)
Chloride: 99 mmol/L (ref 98–111)
Creatinine, Ser: 1.14 mg/dL (ref 0.61–1.24)
GFR calc Af Amer: >60 mL/min (ref >60)
GFR calc non Af Amer: >60 mL/min (ref >60)
Glucose, Bld: 97 mg/dL (ref 70–99)
Potassium: 4 mmol/L (ref 3.5–5.1)
Sodium: 135 mmol/L (ref 135–145)
Total Bilirubin: 0.8 mg/dL (ref 0.3–1.2)
Total Protein: 7.9 g/dL (ref 6.5–8.1)

## 2018-06-08 LAB — CBG MONITORING, ED: Glucose-Capillary: 92 mg/dL (ref 70–99)

## 2018-06-08 LAB — SARS CORONAVIRUS 2 BY RT PCR (HOSPITAL ORDER, PERFORMED IN ~~LOC~~ HOSPITAL LAB): SARS Coronavirus 2: NEGATIVE

## 2018-06-08 MED ORDER — DICYCLOMINE HCL 10 MG PO CAPS
20.0000 mg | ORAL_CAPSULE | Freq: Four times a day (QID) | ORAL | Status: DC | PRN
  Administered 2018-06-08: 19:00:00 20 mg via ORAL
  Filled 2018-06-08 (×2): qty 2

## 2018-06-08 MED ORDER — CLONIDINE HCL 0.1 MG PO TABS: 0.1000 mg | ORAL_TABLET | Freq: Every day | ORAL | Status: DC

## 2018-06-08 MED ORDER — KETOROLAC TROMETHAMINE 30 MG/ML IJ SOLN
30.0000 mg | Freq: Three times a day (TID) | INTRAMUSCULAR | Status: AC
  Administered 2018-06-08 – 2018-06-09 (×2): 30 mg via INTRAVENOUS
  Filled 2018-06-08 (×2): qty 1

## 2018-06-08 MED ORDER — HYDROCHLOROTHIAZIDE 25 MG PO TABS
12.5000 mg | ORAL_TABLET | Freq: Every day | ORAL | Status: DC
  Administered 2018-06-09: 11:00:00 12.5 mg via ORAL
  Filled 2018-06-08: qty 1

## 2018-06-08 MED ORDER — GABAPENTIN 300 MG PO CAPS
300.0000 mg | ORAL_CAPSULE | Freq: Three times a day (TID) | ORAL | Status: DC
  Administered 2018-06-09 – 2018-06-11 (×8): 300 mg via ORAL
  Filled 2018-06-08 (×8): qty 1

## 2018-06-08 MED ORDER — CLONIDINE HCL 0.1 MG PO TABS
0.1000 mg | ORAL_TABLET | Freq: Four times a day (QID) | ORAL | Status: DC
  Administered 2018-06-09 (×3): 0.1 mg via ORAL
  Filled 2018-06-08 (×3): qty 1

## 2018-06-08 MED ORDER — LOPERAMIDE HCL 2 MG PO CAPS
2.0000 mg | ORAL_CAPSULE | ORAL | Status: DC | PRN
  Administered 2018-06-08: 2 mg via ORAL
  Filled 2018-06-08: qty 1

## 2018-06-08 MED ORDER — LISINOPRIL 10 MG PO TABS
10.0000 mg | ORAL_TABLET | Freq: Every day | ORAL | Status: DC
  Administered 2018-06-09: 10 mg via ORAL
  Filled 2018-06-08: qty 1

## 2018-06-08 MED ORDER — SODIUM CHLORIDE 0.9 % IV SOLN
INTRAVENOUS | Status: AC
  Administered 2018-06-08: 23:00:00 via INTRAVENOUS

## 2018-06-08 MED ORDER — SODIUM CHLORIDE 0.9 % IV BOLUS
1000.0000 mL | Freq: Once | INTRAVENOUS | Status: AC
  Administered 2018-06-08: 1000 mL via INTRAVENOUS

## 2018-06-08 MED ORDER — METHOCARBAMOL 1000 MG/10ML IJ SOLN
INTRAMUSCULAR | Status: AC
  Filled 2018-06-08: qty 10

## 2018-06-08 MED ORDER — ACETAMINOPHEN 325 MG PO TABS: 650.0000 mg | ORAL_TABLET | Freq: Four times a day (QID) | ORAL | Status: DC | PRN

## 2018-06-08 MED ORDER — HYDROXYZINE HCL 25 MG PO TABS
25.0000 mg | ORAL_TABLET | Freq: Four times a day (QID) | ORAL | Status: DC | PRN
  Administered 2018-06-08 – 2018-06-10 (×3): 25 mg via ORAL
  Filled 2018-06-08 (×3): qty 1

## 2018-06-08 MED ORDER — DEXMEDETOMIDINE HCL IN NACL 200 MCG/50ML IV SOLN
0.4000 ug/kg/h | INTRAVENOUS | Status: DC
  Administered 2018-06-08: 23:00:00 0.4 ug/kg/h via INTRAVENOUS
  Administered 2018-06-08: 1.2 ug/kg/h via INTRAVENOUS
  Administered 2018-06-09: 0.4 ug/kg/h via INTRAVENOUS
  Administered 2018-06-09: 0.8 ug/kg/h via INTRAVENOUS
  Administered 2018-06-09: 1 ug/kg/h via INTRAVENOUS
  Administered 2018-06-09: 0.4 ug/kg/h via INTRAVENOUS
  Administered 2018-06-09 (×4): 1.2 ug/kg/h via INTRAVENOUS
  Filled 2018-06-08: qty 150
  Filled 2018-06-08: qty 50
  Filled 2018-06-08: qty 150
  Filled 2018-06-08: qty 50

## 2018-06-08 MED ORDER — NAPROXEN 250 MG PO TABS
500.0000 mg | ORAL_TABLET | Freq: Two times a day (BID) | ORAL | Status: DC | PRN
  Administered 2018-06-08: 500 mg via ORAL
  Filled 2018-06-08: qty 2

## 2018-06-08 MED ORDER — METHOCARBAMOL 1000 MG/10ML IJ SOLN
500.0000 mg | Freq: Once | INTRAVENOUS | Status: AC
  Administered 2018-06-08: 23:00:00 500 mg via INTRAVENOUS
  Filled 2018-06-08: qty 5

## 2018-06-08 MED ORDER — LORAZEPAM 2 MG/ML IJ SOLN
1.0000 mg | Freq: Four times a day (QID) | INTRAMUSCULAR | Status: AC
  Administered 2018-06-08 – 2018-06-09 (×3): 1 mg via INTRAVENOUS
  Filled 2018-06-08 (×3): qty 1

## 2018-06-08 MED ORDER — METHOCARBAMOL 500 MG PO TABS
500.0000 mg | ORAL_TABLET | Freq: Three times a day (TID) | ORAL | Status: DC | PRN
  Administered 2018-06-08: 19:00:00 500 mg via ORAL
  Filled 2018-06-08: qty 1

## 2018-06-08 MED ORDER — FENTANYL CITRATE (PF) 100 MCG/2ML IJ SOLN
INTRAMUSCULAR | Status: AC
  Filled 2018-06-08: qty 2

## 2018-06-08 MED ORDER — CLONIDINE HCL 0.1 MG PO TABS
0.1000 mg | ORAL_TABLET | Freq: Four times a day (QID) | ORAL | Status: DC | PRN
  Administered 2018-06-11: 01:00:00 0.1 mg via ORAL
  Filled 2018-06-08: qty 1

## 2018-06-08 MED ORDER — PAROXETINE HCL 30 MG PO TABS
30.0000 mg | ORAL_TABLET | Freq: Every day | ORAL | Status: DC
  Filled 2018-06-08 (×4): qty 1

## 2018-06-08 MED ORDER — ENOXAPARIN SODIUM 60 MG/0.6ML ~~LOC~~ SOLN
60.0000 mg | SUBCUTANEOUS | Status: DC
  Administered 2018-06-09 (×2): 60 mg via SUBCUTANEOUS
  Filled 2018-06-08 (×2): qty 0.6

## 2018-06-08 MED ORDER — METHOCARBAMOL 500 MG PO TABS
500.0000 mg | ORAL_TABLET | Freq: Four times a day (QID) | ORAL | Status: DC
  Administered 2018-06-08 – 2018-06-11 (×10): 500 mg via ORAL
  Filled 2018-06-08 (×10): qty 1

## 2018-06-08 MED ORDER — ONDANSETRON HCL 4 MG/2ML IJ SOLN
4.0000 mg | Freq: Once | INTRAMUSCULAR | Status: AC
  Administered 2018-06-08: 19:00:00 4 mg via INTRAVENOUS
  Filled 2018-06-08: qty 2

## 2018-06-08 MED ORDER — ONDANSETRON 4 MG PO TBDP: 4.0000 mg | ORAL_TABLET | Freq: Four times a day (QID) | ORAL | Status: DC | PRN

## 2018-06-08 MED ORDER — CLONIDINE HCL 0.1 MG PO TABS: 0.1000 mg | ORAL_TABLET | ORAL | Status: DC

## 2018-06-08 MED ORDER — DEXMEDETOMIDINE HCL IN NACL 200 MCG/50ML IV SOLN
INTRAVENOUS | Status: AC
  Filled 2018-06-08: qty 50

## 2018-06-08 MED ORDER — BUPRENORPHINE HCL-NALOXONE HCL 8-2 MG SL SUBL
1.0000 | SUBLINGUAL_TABLET | Freq: Once | SUBLINGUAL | Status: AC
  Administered 2018-06-08: 19:00:00 1 via SUBLINGUAL
  Filled 2018-06-08: qty 1

## 2018-06-08 MED ORDER — CLONIDINE HCL 0.1 MG PO TABS: 0.1000 mg | ORAL_TABLET | Freq: Four times a day (QID) | ORAL | Status: DC

## 2018-06-08 MED ORDER — FENTANYL CITRATE (PF) 100 MCG/2ML IJ SOLN
25.0000 ug | INTRAMUSCULAR | Status: DC | PRN
  Administered 2018-06-08: 23:00:00 50 ug via INTRAVENOUS

## 2018-06-08 MED ORDER — ACETAMINOPHEN 650 MG RE SUPP: 650.0000 mg | Freq: Four times a day (QID) | RECTAL | Status: DC | PRN

## 2018-06-08 MED ORDER — CLONIDINE HCL 0.2 MG PO TABS
0.2000 mg | ORAL_TABLET | Freq: Once | ORAL | Status: AC
  Administered 2018-06-08: 19:00:00 0.2 mg via ORAL
  Filled 2018-06-08: qty 1

## 2018-06-08 MED ORDER — LORAZEPAM 2 MG/ML IJ SOLN
1.0000 mg | Freq: Once | INTRAMUSCULAR | Status: AC
  Administered 2018-06-08: 22:00:00 1 mg via INTRAVENOUS
  Filled 2018-06-08: qty 1

## 2018-06-08 NOTE — Progress Notes (Signed)
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

## 2018-06-08 NOTE — ED Notes (Signed)
Updated patients wife on plan of care.  

## 2018-06-08 NOTE — ED Triage Notes (Signed)
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

## 2018-06-08 NOTE — H&P (Signed)
TRH H&P    Patient Demographics:    Patrick Cruz, is a 58 y.o. male  MRN: [id number]  DOB - [birth year]  Admit Date - 06/08/2018  Referring MD/NP/PA:   Sherwood Gambler  Outpatient Primary MD for the patient is Patient, No Pcp Per Jeanice Lim PA (Family Medicine) Norma Fredrickson (psychiatry) Lonia Chimera Pol (psychiatry, neurology) Joiner  Patient coming from:  home  Chief complaint- opioid withdrawal   HPI:    Patrick Cruz  is a 58 y.o. male,   w hypertension, ADD,  chronic back pain/lumbar radiculopathy apparently previously on methadone 149m po qday, stopped this 2 days ago and started suboxone this AM.  Pt has had increase in pain as well as feeling shaky and loose stool, and anxious.    Review of MRI Lumbar spine 01/12/2018 IMPRESSION: 1. Grade 1 L4-5 anterolisthesis with prominent facet hypertrophy. 2. Grade 1 L5-S1 anterolisthesis with bilateral L5 chronic pars defects. 3. Moderate lumbar spine levocurvature. 4. Lumbar spondylosis with predominantly lower lumbar facet arthrosis greatest at L4-5 and L5-S1. 5. Moderate to severe right L4-5 and moderate left L5-S1 foraminal stenosis. Multilevel mild foraminal stenosis. 6. Mild L4-5 canal stenosis.  No high-grade spinal canal stenosis. [facility] Lisinopril 19mpo qday for hypertension on 05/08/2018  Review of PDMP Adderall ER 3092mid  GerNorma Fredrickson4/2020 Clonazepam 1mg53m tid GeraNorma Fredrickson4/2020  Methadone and Suboxone do not show up on PDMP Methadone 100mg55mqday   ??? - not sure who is prescribing Suboxone 8/2mg ?65m- not sure who is prescribing   In ED,  Pt appears anxious, and voices back pain, nausea, loose stool. Has difficulty sitting still.   T 99.7  P 93  R 28 Bp 160/125  Pox 100% on RA Wt 120.2 kg  Na 135, K 4.0, Bun 24 , Creatinine 1.14 Ast 125, Alt 84, Alk phos 205  T. Bili 0.8  Wbc 11.3, Hgb 12.3, Plt 372  Pt will be admitted for opioid withdrawal , abnormal liver function, anemia.        Review of systems:    In addition to the HPI above,  No Fever-chills, No Headache, No changes with Vision or hearing, No problems swallowing food or Liquids, No Chest pain, Cough or Shortness of Breath, No Abdominal pain, No Blood in stool or Urine, No dysuria, No new skin rashes or bruises, No new joints pains-aches,  + chronic back pain No new weakness, tingling, numbness in any extremity, No recent weight gain or loss, No polyuria, polydypsia or polyphagia, No significant Mental Stressors.  All other systems reviewed and are negative.    Past History of the following :    Past Medical History:  Diagnosis Date  . Arthritis   . Chronic back pain       Past Surgical History:  Procedure Laterality Date  . KNEE ARTHROSCOPY W/ DEBRIDEMENT        Social History:      Social History   Tobacco Use  .  Smoking status: Never Smoker  . Smokeless tobacco: Never Used  Substance Use Topics  . Alcohol use: Never    Frequency: Never       Family History :     Family History  Problem Relation Age of Onset  . Obesity Other        Home Medications:   Prior to Admission medications   Medication Sig Start Date End Date Taking? Authorizing Provider  amphetamine-dextroamphetamine (ADDERALL XR) 30 MG 24 hr capsule Take 30 mg by mouth 3 (three) times daily. 01/01/18   [provider]  clonazePAM (KLONOPIN) 1 MG tablet Take 1 mg by mouth 2 (two) times daily. 12/07/17   [provider]  hydrochlorothiazide (HYDRODIURIL) 12.5 MG tablet Take 1 tablet (12.5 mg total) by mouth daily. 01/10/18   Hedges, Dellis Filbert, PA-C  ibuprofen (ADVIL,MOTRIN) 800 MG tablet Take 1 tablet (800 mg total) by mouth every 8 (eight) hours as needed for mild pain. 01/12/18   Ward, Delice Bison, DO  ondansetron (ZOFRAN ODT) 4 MG disintegrating tablet Take 1  tablet (4 mg total) by mouth every 6 (six) hours as needed. 01/12/18   Ward, Delice Bison, DO  oxyCODONE-acetaminophen (PERCOCET/ROXICET) 5-325 MG tablet Take 2 tablets by mouth every 6 (six) hours as needed. 01/12/18   Ward, Delice Bison, DO  PARoxetine (PAXIL) 30 MG tablet Take 30 mg by mouth 2 (two) times daily. 10/25/17   [provider]  predniSONE (STERAPRED UNI-PAK 21 TAB) 10 MG (21) TBPK tablet Take as directed 01/12/18   Ward, Delice Bison, DO     Allergies:    No Known Allergies   Physical Exam:   Vitals  Blood pressure (!) 198/109, pulse 93, temperature 99.7 F (37.6 C), temperature source Temporal, resp. rate (!) 28, height 6' (1.829 m), weight 120.2 kg, SpO2 100 %.  1.  General: Axox3,   2. Psychiatric: euthymic  3. Neurologic: cn2-12 intact, reflexes 2+ symmetric, diffuse with no clonus, motor 5/5 in all 4 ext  4. HEENMT:  Anicteric, pupils 1.73m symmetric, direct, consensual, near intact eomi Mmm Neck: no jvd, no bruit  5. Respiratory : CTAB  6. Cardiovascular : RRR s1, s2, no m/g/r  7. Gastrointestinal:  Abd:  Morbidly obese, nt, nd, +bs, no hsm  8. Skin:  Ext: no c/c/e, no rash  9.Musculoskeletal:  Good ROM  No adenopathy    Data Review:    CBC Recent Labs  Lab 06/08/18 1815  WBC 11.3*  HGB 12.3*  HCT 39.6  PLT 372  MCV 86.1  MCH 26.7  MCHC 31.1  RDW 13.9  LYMPHSABS 3.3  MONOABS 0.7  EOSABS 0.1  BASOSABS 0.1   ------------------------------------------------------------------------------------------------------------------  Results for orders placed or performed during the hospital encounter of 06/08/18 (from the past 48 hour(s))  Comprehensive metabolic panel     Status: Abnormal   Collection Time: 06/08/18  6:15 PM  Result Value Ref Range   Sodium 135 135 - 145 mmol/L   Potassium 4.0 3.5 - 5.1 mmol/L   Chloride 99 98 - 111 mmol/L   CO2 24 22 - 32 mmol/L   Glucose, Bld 97 70 - 99 mg/dL   BUN 24 (H) 6 - 20 mg/dL    Creatinine, Ser 1.14 0.61 - 1.24 mg/dL   Calcium 9.1 8.9 - 10.3 mg/dL   Total Protein 7.9 6.5 - 8.1 g/dL   Albumin 4.0 3.5 - 5.0 g/dL   AST 125 (H) 15 - 41 U/L   ALT 84 (H) 0 -  44 U/L   Alkaline Phosphatase 205 (H) 38 - 126 U/L   Total Bilirubin 0.8 0.3 - 1.2 mg/dL   GFR calc non Af Amer >60 >60 mL/min   GFR calc Af Amer >60 >60 mL/min   Anion gap 12 5 - 15    Comment: Performed at [facility], [address] [place], [place] [address]  CBC with Differential     Status: Abnormal   Collection Time: 06/08/18  6:15 PM  Result Value Ref Range   WBC 11.3 (H) 4.0 - 10.5 K/uL   RBC 4.60 4.22 - 5.81 MIL/uL   Hemoglobin 12.3 (L) 13.0 - 17.0 g/dL   HCT 39.6 39.0 - 52.0 %   MCV 86.1 80.0 - 100.0 fL   MCH 26.7 26.0 - 34.0 pg   MCHC 31.1 30.0 - 36.0 g/dL   RDW 13.9 11.5 - 15.5 %   Platelets 372 150 - 400 K/uL   nRBC 0.0 0.0 - 0.2 %   Neutrophils Relative % 63 %   Neutro Abs 7.1 1.7 - 7.7 K/uL   Lymphocytes Relative 29 %   Lymphs Abs 3.3 0.7 - 4.0 K/uL   Monocytes Relative 6 %   Monocytes Absolute 0.7 0.1 - 1.0 K/uL   Eosinophils Relative 1 %   Eosinophils Absolute 0.1 0.0 - 0.5 K/uL   Basophils Relative 0 %   Basophils Absolute 0.1 0.0 - 0.1 K/uL   Immature Granulocytes 1 %   Abs Immature Granulocytes 0.10 (H) 0.00 - 0.07 K/uL    Comment: Performed at [facility], [address] [place], [place] [address]  CBG monitoring, ED     Status: None   Collection Time: 06/08/18  6:56 PM  Result Value Ref Range   Glucose-Capillary 92 70 - 99 mg/dL  Rapid urine drug screen (hospital performed)     Status: None   Collection Time: 06/08/18  7:56 PM  Result Value Ref Range   Opiates NONE DETECTED NONE DETECTED   Cocaine NONE DETECTED NONE DETECTED   Benzodiazepines NONE DETECTED NONE DETECTED   Amphetamines NONE DETECTED NONE DETECTED   Tetrahydrocannabinol NONE DETECTED NONE DETECTED   Barbiturates NONE DETECTED NONE DETECTED    Comment: (NOTE) DRUG SCREEN FOR MEDICAL PURPOSES ONLY.  IF  CONFIRMATION IS NEEDED FOR ANY PURPOSE, NOTIFY LAB WITHIN 5 DAYS. LOWEST DETECTABLE LIMITS FOR URINE DRUG SCREEN Drug Class                     Cutoff (ng/mL) Amphetamine and metabolites    1000 Barbiturate and metabolites    200 Benzodiazepine                 638 Tricyclics and metabolites     300 Opiates and metabolites        300 Cocaine and metabolites        300 THC                            50 Performed at [facility], [address], [place], [place] [address]     Chemistries  Recent Labs  Lab 06/08/18 1815  NA 135  K 4.0  CL 99  CO2 24  GLUCOSE 97  BUN 24*  CREATININE 1.14  CALCIUM 9.1  AST 125*  ALT 84*  ALKPHOS 205*  BILITOT 0.8   ------------------------------------------------------------------------------------------------------------------  ------------------------------------------------------------------------------------------------------------------ GFR: Estimated Creatinine Clearance: 93.4 mL/min (by C-G formula based on SCr of 1.14  mg/dL). Liver Function Tests: Recent Labs  Lab 06/08/18 1815  AST 125*  ALT 84*  ALKPHOS 205*  BILITOT 0.8  PROT 7.9  ALBUMIN 4.0   No results for input(s): LIPASE, AMYLASE in the last 168 hours. No results for input(s): AMMONIA in the last 168 hours. Coagulation Profile: No results for input(s): INR, PROTIME in the last 168 hours. Cardiac Enzymes: No results for input(s): CKTOTAL, CKMB, CKMBINDEX, TROPONINI in the last 168 hours. BNP (last 3 results) No results for input(s): PROBNP in the last 8760 hours. HbA1C: No results for input(s): HGBA1C in the last 72 hours. CBG: Recent Labs  Lab 06/08/18 1856  GLUCAP 92   Lipid Profile: No results for input(s): CHOL, HDL, LDLCALC, TRIG, CHOLHDL, LDLDIRECT in the last 72 hours. Thyroid Function Tests: No results for input(s): TSH, T4TOTAL, FREET4, T3FREE, THYROIDAB in the last 72 hours. Anemia Panel: No results for input(s): VITAMINB12, FOLATE, FERRITIN,  TIBC, IRON, RETICCTPCT in the last 72 hours.  --------------------------------------------------------------------------------------------------------------- Urine analysis: No results found for: COLORURINE, APPEARANCEUR, LABSPEC, PHURINE, GLUCOSEU, HGBUR, BILIRUBINUR, KETONESUR, PROTEINUR, UROBILINOGEN, NITRITE, LEUKOCYTESUR    Imaging Results:    No results found. EKG nsr at 95, nl axis, nl pr, prolonged QT, no st-t changes c/w ischemia   Assessment & Plan:    Principal Problem:   Opioid withdrawal (HCC) Active Problems:   Hypertensive urgency   Lumbar radiculopathy, chronic   Anemia   Abnormal liver function   Opioid withdrawal Start Ativan 60m qid (warned pt of respiratory affects of this medication, and possible additive affect with Suboxone, pt aware of risk and willing to take) Start Clonidine 0.126mpo qid Start Hydroxyzine 2577mo qid prn Please try to find out in AM which clinic he is getting his methadone/ suboxone On discharge, please let Dr. PloCasimiro Needleow that the patient has been on methadone and suboxone    Chronic back pain/ lumbar radiculopathy Suboxone 8/2mg69m received in ED Start Toradol 30mg4mq8h Start Robaxin 500mg 17m1 then 500mg p73md and q8h prn Please find out in AM which clinic he is getting his suboxone , typically max dose is suboxone 8/2mg sl 68m  Hypertensive urgency Clonidine 0.1mg po q48mClonidine 0.1mg po q620mrn sbp >160 Cont hydrochlorothiazide Cont Lisinopril 10mg po qd34mAbnomal liver function Check acute hepatitis panel Check GGT Check RUQ ultrasound Check cmp in am  Anemia Check ferritin, iron, tibc, folate, b12, ESR If ESR is elevated may need SPEP/ UPEP Check cbc in am   DVT Prophylaxis-   Lovenox - SCDs   AM Labs Ordered, also please review Full Orders  Family Communication: Admission, patients condition and plan of care including tests being ordered have been discussed with the patient who indicate understanding  and agree with the plan and Code Status.  Code Status:  FULL CODE  Admission status: Observation : Based on patients clinical presentation and evaluation of above clinical data, I have made determination that patient meets Observation criteria at this time.  Pt will require admission for management of hypertensive urgency due to suboxone precipitated opioid withdrawal as wll as iv medications to control his agitation and pain.   Time spent in minutes : 70 minutes   Carleena Mires MJani Gravel/2020 at 9:39 PM

## 2018-06-08 NOTE — ED Provider Notes (Signed)
[facility] EMERGENCY DEPARTMENT Provider Note   CSN: [id number] Arrival date & time: 06/08/18  1732    History   Chief Complaint Chief Complaint  Patient presents with  . drug withdrawal    HPI Patrick Cruz is a 58 y.o. male.     HPI  58 year old male presents with acute withdrawal.  He states that he has been on methadone for about 14 years for chronic back problems.  His pain specialist is trying to switch him to Suboxone.  He last took methadone yesterday.  His dose has been 100 mg daily.  He took 8 mg of Suboxone at around 1:30 PM today.  He states about an hour later he has developed withdrawal symptoms including severe back pain and leg pain, vomiting, feeling like he needs to have diarrhea and restlessness.  He denies any fevers or other illness.  Past Medical History:  Diagnosis Date  . Arthritis   . Chronic back pain     Patient Active Problem List   Diagnosis Date Noted  . Hypertensive urgency 06/08/2018    History reviewed. No pertinent surgical history.      Home Medications    Prior to Admission medications   Medication Sig Start Date End Date Taking? Authorizing Provider  amphetamine-dextroamphetamine (ADDERALL XR) 30 MG 24 hr capsule Take 30 mg by mouth 3 (three) times daily. 01/01/18   [provider]  clonazePAM (KLONOPIN) 1 MG tablet Take 1 mg by mouth 2 (two) times daily. 12/07/17   [provider]  hydrochlorothiazide (HYDRODIURIL) 12.5 MG tablet Take 1 tablet (12.5 mg total) by mouth daily. 01/10/18   Hedges, Dellis Filbert, PA-C  ibuprofen (ADVIL,MOTRIN) 800 MG tablet Take 1 tablet (800 mg total) by mouth every 8 (eight) hours as needed for mild pain. 01/12/18   Ward, Delice Bison, DO  ondansetron (ZOFRAN ODT) 4 MG disintegrating tablet Take 1 tablet (4 mg total) by mouth every 6 (six) hours as needed. 01/12/18   Ward, Delice Bison, DO  oxyCODONE-acetaminophen (PERCOCET/ROXICET) 5-325 MG tablet Take 2 tablets by mouth every 6 (six) hours as  needed. 01/12/18   Ward, Delice Bison, DO  PARoxetine (PAXIL) 30 MG tablet Take 30 mg by mouth 2 (two) times daily. 10/25/17   [provider]  predniSONE (STERAPRED UNI-PAK 21 TAB) 10 MG (21) TBPK tablet Take as directed 01/12/18   Ward, Delice Bison, DO    Family History No family history on file.  Social History Social History   Tobacco Use  . Smoking status: Never Smoker  . Smokeless tobacco: Never Used  Substance Use Topics  . Alcohol use: Never    Frequency: Never  . Drug use: Never     Allergies   Patient has no known allergies.   Review of Systems Review of Systems  Constitutional: Negative for fever.  Respiratory: Negative for cough.   Gastrointestinal: Positive for abdominal pain, diarrhea and vomiting.  Musculoskeletal: Positive for back pain.  All other systems reviewed and are negative.    Physical Exam Updated Vital Signs BP (!) 198/109   Pulse 93   Temp 99.7 F (37.6 C) (Temporal)   Resp (!) 28   Ht 6' (1.829 m)   Wt 120.2 kg   SpO2 100%   BMI 35.94 kg/m   Physical Exam Vitals signs and nursing note reviewed.  Constitutional:      General: He is in acute distress.     Appearance: He is well-developed. He is not diaphoretic.  Comments: Patient is rolling on stretcher, crying, appears very uncomfortable. Restless.  HENT:     Head: Normocephalic and atraumatic.     Right Ear: External ear normal.     Left Ear: External ear normal.     Nose: Nose normal.  Eyes:     General:        Right eye: No discharge.        Left eye: No discharge.  Neck:     Musculoskeletal: Neck supple.  Cardiovascular:     Rate and Rhythm: Normal rate and regular rhythm.     Heart sounds: Normal heart sounds.  Pulmonary:     Effort: Tachypnea present. No accessory muscle usage or respiratory distress.     Breath sounds: Normal breath sounds.  Abdominal:     General: There is no distension.     Palpations: Abdomen is soft.     Tenderness: There is  abdominal tenderness (mild generalized tenderness).  Skin:    General: Skin is warm and dry.  Neurological:     Mental Status: He is alert.  Psychiatric:        Mood and Affect: Mood is not anxious.      ED Treatments / Results  Labs (all labs ordered are listed, but only abnormal results are displayed) Labs Reviewed  COMPREHENSIVE METABOLIC PANEL - Abnormal; Notable for the following components:      Result Value   BUN 24 (*)    AST 125 (*)    ALT 84 (*)    Alkaline Phosphatase 205 (*)    All other components within normal limits  CBC WITH DIFFERENTIAL/PLATELET - Abnormal; Notable for the following components:   WBC 11.3 (*)    Hemoglobin 12.3 (*)    Abs Immature Granulocytes 0.10 (*)    All other components within normal limits  RAPID URINE DRUG SCREEN, HOSP PERFORMED  CBG MONITORING, ED    EKG EKG Interpretation  Date/Time:  Thursday Jun 08 2018 19:21:26 EDT Ventricular Rate:  95 PR Interval:    QRS Duration: 103 QT Interval:  404 QTC Calculation: 508 R Axis:   35 Text Interpretation:  Sinus rhythm Borderline T abnormalities, anterior leads Prolonged QT interval Baseline wander in lead(s) II III aVF V5 Poor data quality in current ECG precludes serial comparison Confirmed by Sherwood Gambler [phone]) on 06/08/2018 7:29:13 PM   Radiology No results found.  Procedures Procedures (including critical care time)  Medications Ordered in ED Medications  dicyclomine (BENTYL) capsule 20 mg (20 mg Oral Given 06/08/18 1850)  hydrOXYzine (ATARAX/VISTARIL) tablet 25 mg (25 mg Oral Given 06/08/18 1850)  loperamide (IMODIUM) capsule 2-4 mg (2 mg Oral Given 06/08/18 1850)  methocarbamol (ROBAXIN) tablet 500 mg (500 mg Oral Given 06/08/18 1850)  naproxen (NAPROSYN) tablet 500 mg (500 mg Oral Given 06/08/18 1849)  ondansetron (ZOFRAN-ODT) disintegrating tablet 4 mg (has no administration in time range)  sodium chloride 0.9 % bolus 1,000 mL (0 mLs Intravenous Stopped 06/08/18 1950)   ondansetron (ZOFRAN) injection 4 mg (4 mg Intravenous Given 06/08/18 1849)  buprenorphine-naloxone (SUBOXONE) 8-2 mg per SL tablet 1 tablet (1 tablet Sublingual Given 06/08/18 1916)  cloNIDine (CATAPRES) tablet 0.2 mg (0.2 mg Oral Given 06/08/18 1916)     Initial Impression / Assessment and Plan / ED Course  I have reviewed the triage vital signs and the nursing notes.  Pertinent labs & imaging results that were available during my care of the patient were reviewed by me and considered in my  medical decision making (see chart for details).        I discussed with [facility] center toxicology, Dr. Brigitte Pulse.  He recommends that he will need 8 mg Suboxone as best treatment for this precipitated withdrawal.  Also endorses the other adjunct of therapy that is typical for opiate withdrawal in addition to 0.2 mg clonidine.  If these do not control his symptoms he may need to be admitted for symptom control and further dosing with clonidine, and starting buprenorphine tomorrow. He's still having uncontrolled symptoms at this time, thus will admit for supportive care.   Final Clinical Impressions(s) / ED Diagnoses   Final diagnoses:  Opiate withdrawal [facility])    ED Discharge Orders    None       Sherwood Gambler, MD 06/08/18 2014

## 2018-06-09 ENCOUNTER — Inpatient Hospital Stay (HOSPITAL_COMMUNITY): Payer: Medicare HMO

## 2018-06-09 DIAGNOSIS — Z20828 Contact with and (suspected) exposure to other viral communicable diseases: Secondary | ICD-10-CM | POA: Diagnosis not present

## 2018-06-09 DIAGNOSIS — M1990 Unspecified osteoarthritis, unspecified site: Secondary | ICD-10-CM | POA: Diagnosis not present

## 2018-06-09 DIAGNOSIS — F1123 Opioid dependence with withdrawal: Secondary | ICD-10-CM | POA: Diagnosis present

## 2018-06-09 DIAGNOSIS — F1193 Opioid use, unspecified with withdrawal: Secondary | ICD-10-CM | POA: Diagnosis present

## 2018-06-09 DIAGNOSIS — N179 Acute kidney failure, unspecified: Secondary | ICD-10-CM | POA: Diagnosis not present

## 2018-06-09 DIAGNOSIS — M4726 Other spondylosis with radiculopathy, lumbar region: Secondary | ICD-10-CM | POA: Diagnosis not present

## 2018-06-09 DIAGNOSIS — I160 Hypertensive urgency: Secondary | ICD-10-CM | POA: Diagnosis not present

## 2018-06-09 DIAGNOSIS — I10 Essential (primary) hypertension: Secondary | ICD-10-CM | POA: Diagnosis not present

## 2018-06-09 DIAGNOSIS — D649 Anemia, unspecified: Secondary | ICD-10-CM | POA: Diagnosis not present

## 2018-06-09 DIAGNOSIS — R7989 Other specified abnormal findings of blood chemistry: Secondary | ICD-10-CM | POA: Diagnosis not present

## 2018-06-09 DIAGNOSIS — M4807 Spinal stenosis, lumbosacral region: Secondary | ICD-10-CM | POA: Diagnosis not present

## 2018-06-09 DIAGNOSIS — M4317 Spondylolisthesis, lumbosacral region: Secondary | ICD-10-CM | POA: Diagnosis not present

## 2018-06-09 DIAGNOSIS — R69 Illness, unspecified: Secondary | ICD-10-CM | POA: Diagnosis not present

## 2018-06-09 LAB — COMPREHENSIVE METABOLIC PANEL
ALT: 60 U/L — ABNORMAL HIGH (ref 0–44)
AST: 63 U/L — ABNORMAL HIGH (ref 15–41)
Albumin: 3.5 g/dL (ref 3.5–5.0)
Alkaline Phosphatase: 194 U/L — ABNORMAL HIGH (ref 38–126)
Anion gap: 8 (ref 5–15)
BUN: 19 mg/dL (ref 6–20)
CO2: 23 mmol/L (ref 22–32)
Calcium: 8.6 mg/dL — ABNORMAL LOW (ref 8.9–10.3)
Chloride: 105 mmol/L (ref 98–111)
Creatinine, Ser: 1.01 mg/dL (ref 0.61–1.24)
GFR calc Af Amer: >60 mL/min (ref >60)
GFR calc non Af Amer: >60 mL/min (ref >60)
Glucose, Bld: 163 mg/dL — ABNORMAL HIGH (ref 70–99)
Potassium: 3.8 mmol/L (ref 3.5–5.1)
Sodium: 136 mmol/L (ref 135–145)
Total Bilirubin: 0.5 mg/dL (ref 0.3–1.2)
Total Protein: 7.2 g/dL (ref 6.5–8.1)

## 2018-06-09 LAB — CBC
HCT: 39.3 % (ref 39.0–52.0)
Hemoglobin: 12.5 g/dL — ABNORMAL LOW (ref 13.0–17.0)
MCH: 27.3 pg (ref 26.0–34.0)
MCHC: 31.8 g/dL (ref 30.0–36.0)
MCV: 85.8 fL (ref 80.0–100.0)
Platelets: 328 10*3/uL (ref 150–400)
RBC: 4.58 MIL/uL (ref 4.22–5.81)
RDW: 13.6 % (ref 11.5–15.5)
WBC: 11.9 10*3/uL — ABNORMAL HIGH (ref 4.0–10.5)
nRBC: 0 % (ref 0.0–0.2)

## 2018-06-09 LAB — GAMMA GT: GGT: 154 U/L — ABNORMAL HIGH (ref 7–50)

## 2018-06-09 LAB — SEDIMENTATION RATE: Sed Rate: 50 mm/hr — ABNORMAL HIGH (ref 0–16)

## 2018-06-09 LAB — IRON AND TIBC
Iron: 87 ug/dL (ref 45–182)
Saturation Ratios: 25 % (ref 17.9–39.5)
TIBC: 341 ug/dL (ref 250–450)
UIBC: 254 ug/dL

## 2018-06-09 LAB — VITAMIN B12: Vitamin B-12: 419 pg/mL (ref 180–914)

## 2018-06-09 LAB — MRSA PCR SCREENING: MRSA by PCR: NEGATIVE

## 2018-06-09 LAB — FERRITIN: Ferritin: 115 ng/mL (ref 24–336)

## 2018-06-09 MED ORDER — PAROXETINE HCL 10 MG PO TABS
30.0000 mg | ORAL_TABLET | Freq: Every day | ORAL | Status: DC
  Filled 2018-06-09 (×4): qty 3

## 2018-06-09 MED ORDER — CHLORHEXIDINE GLUCONATE CLOTH 2 % EX PADS
6.0000 | MEDICATED_PAD | Freq: Every day | CUTANEOUS | Status: DC
  Administered 2018-06-10: 12:00:00 6 via TOPICAL

## 2018-06-09 MED ORDER — PAROXETINE HCL 20 MG PO TABS
30.0000 mg | ORAL_TABLET | Freq: Every day | ORAL | Status: DC
  Administered 2018-06-09 – 2018-06-11 (×3): 30 mg via ORAL
  Filled 2018-06-09 (×3): qty 2

## 2018-06-09 NOTE — Care Management Important Message (Signed)
Important Message  Patient Details  Name: Patrick Cruz MRN: [id number] Date of Birth: 11-02-1960   Medicare Important Message Given:  Yes    Tommy Medal 06/09/2018, 2:48 PM

## 2018-06-09 NOTE — Progress Notes (Signed)
eLink Physician-Brief Progress Note Patient Name: Patrick Cruz DOB: 07-08-1960 MRN: [id number]   Date of Service  06/09/2018  HPI/Events of Note  Pt needs admission status changed to ICU due to pt being on Precedex  eICU Interventions  Pt admit status upgraded to ICU status        Frederik Pear 06/09/2018, 6:15 AM

## 2018-06-09 NOTE — Progress Notes (Signed)
PROGRESS NOTE    Patrick Cruz  SPQ:[id number] DOB: 1960-05-02 DOA: 06/08/2018 PCP: Patient, No Pcp Per   Brief Narrative:  Per HPI: Patrick Cruz  is a 58 y.o. male,   w hypertension, ADD,  chronic back pain/lumbar radiculopathy apparently previously on methadone 154m po qday, stopped this 2 days ago and started suboxone this AM.  Pt has had increase in pain as well as feeling shaky and loose stool, and anxious.    Patient was admitted with opioid withdrawal and was recently supposed to transition to Suboxone at which point he developed his symptoms.  He was also noted to have some hypertensive urgency.  He was transitioned to ICU overnight on account of Precedex drip.  Assessment & Plan:   Principal Problem:   Opioid withdrawal ([place] Active Problems:   Hypertensive urgency   Lumbar radiculopathy, chronic   Anemia   Abnormal liver function   Narcotic withdrawal (HCC)  Opioid withdrawal Wean off Precedex and transition to Ativan 1 mg 4 times daily Start Clonidine 0.154mpo qid Start Hydroxyzine 2556mo qid prn We will try to find out methadone/Suboxone clinic the patient is attending On discharge, please let Dr. PloCasimiro Needleow that the patient has been on methadone and suboxone    Chronic back pain/ lumbar radiculopathy Suboxone 8/2mg62m received in ED Start Toradol 30mg67mq8h Start Robaxin 500mg 69m1 then 500mg p63md and q8h prn We will plan to talk to wife about Suboxone clinic, typically max dose is suboxone 8/2mg sl 52m  Hypertensive urgency-improved Clonidine 0.1mg po q74mClonidine 0.1mg po q652mrn sbp >160 Cont hydrochlorothiazide Cont Lisinopril 10mg po qd17mAbnomal liver function (labs pending this am) Check acute hepatitis panel Check GGT Check RUQ ultrasound Check cmp in am  Anemia-stable Check ferritin, iron, tibc, folate, b12, ESR-pending If ESR is elevated may need SPEP/ UPEP Check cbc in am   DVT prophylaxis: Lovenox Code Status: Full  Family Communication: We will plan to call wife later Disposition Plan: Continue to treat opioid withdrawal and wean off Precedex drip as tolerated.  We will plan to obtain information about opioid clinic today.   Consultants:   None  Procedures:   None  Antimicrobials:   None   Subjective: Patient seen and evaluated today and is currently sedated on Precedex.  Patient did have some difficulty urinating last night, but is now able to urinate.  He is unarousable.  Objective: Vitals:   06/09/18 0600 06/09/18 0615 06/09/18 0630 06/09/18 0633  BP: (!) 172/88 (!) 166/82 (!) 162/85 (!) 162/85  Pulse: (!) 54 (!) 52 (!) 53   Resp: (!) 22 (!) 22 (!) 22   Temp: (!) 97.5 F (36.4 C)     TempSrc: Axillary     SpO2: 94% 93% 93%   Weight:      Height:        Intake/Output Summary (Last 24 hours) at 06/09/2018 0702 Last data filed at 06/09/2018 0602 Gross per 24 hour  Intake 716.31 ml  Output 1600 ml  Net -883.69 ml   Filed Weights   06/08/18 1748 06/08/18 2336 06/09/18 0500  Weight: 120.2 kg 118.1 kg 117.3 kg    Examination:  General exam: Sedated and unarousable Respiratory system: Clear to auscultation. Respiratory effort normal. Cardiovascular system: S1 & S2 heard, RRR. No JVD, murmurs, rubs, gallops or clicks. No pedal edema. Gastrointestinal system: Abdomen is nondistended, soft and nontender. No organomegaly or masses felt. Normal bowel sounds heard. Central  nervous system: Sedated unarousable Extremities: Symmetric 5 x 5 power. Skin: No rashes, lesions or ulcers Psychiatry: Cannot be assessed.    Data Reviewed: I have personally reviewed following labs and imaging studies  CBC: Recent Labs  Lab 06/08/18 1815 06/09/18 0541  WBC 11.3* 11.9*  NEUTROABS 7.1  --   HGB 12.3* 12.5*  HCT 39.6 39.3  MCV 86.1 85.8  PLT 372 527   Basic Metabolic Panel: Recent Labs  Lab 06/08/18 1815  NA 135  K 4.0  CL 99  CO2 24  GLUCOSE 97  BUN 24*  CREATININE 1.14   CALCIUM 9.1   GFR: Estimated Creatinine Clearance: 92.3 mL/min (by C-G formula based on SCr of 1.14 mg/dL). Liver Function Tests: Recent Labs  Lab 06/08/18 1815  AST 125*  ALT 84*  ALKPHOS 205*  BILITOT 0.8  PROT 7.9  ALBUMIN 4.0   No results for input(s): LIPASE, AMYLASE in the last 168 hours. No results for input(s): AMMONIA in the last 168 hours. Coagulation Profile: No results for input(s): INR, PROTIME in the last 168 hours. Cardiac Enzymes: No results for input(s): CKTOTAL, CKMB, CKMBINDEX, TROPONINI in the last 168 hours. BNP (last 3 results) No results for input(s): PROBNP in the last 8760 hours. HbA1C: No results for input(s): HGBA1C in the last 72 hours. CBG: Recent Labs  Lab 06/08/18 1856  GLUCAP 92   Lipid Profile: No results for input(s): CHOL, HDL, LDLCALC, TRIG, CHOLHDL, LDLDIRECT in the last 72 hours. Thyroid Function Tests: No results for input(s): TSH, T4TOTAL, FREET4, T3FREE, THYROIDAB in the last 72 hours. Anemia Panel: No results for input(s): VITAMINB12, FOLATE, FERRITIN, TIBC, IRON, RETICCTPCT in the last 72 hours. Sepsis Labs: No results for input(s): PROCALCITON, LATICACIDVEN in the last 168 hours.  Recent Results (from the past 240 hour(s))  SARS Coronavirus 2 [facility] order, Performed in [facility] hospital lab)     Status: None   Collection Time: 06/08/18  8:40 PM  Result Value Ref Range Status   SARS Coronavirus 2 NEGATIVE NEGATIVE Final    Comment: (NOTE) If result is NEGATIVE SARS-CoV-2 target nucleic acids are NOT DETECTED. The SARS-CoV-2 RNA is generally detectable in upper and lower  respiratory specimens during the acute phase of infection. The lowest  concentration of SARS-CoV-2 viral copies this assay can detect is 250  copies / mL. A negative result does not preclude SARS-CoV-2 infection  and should not be used as the sole basis for treatment or other  patient management decisions.  A negative result may occur with   improper specimen collection / handling, submission of specimen other  than nasopharyngeal swab, presence of viral mutation(s) within the  areas targeted by this assay, and inadequate number of viral copies  (<250 copies / mL). A negative result must be combined with clinical  observations, patient history, and epidemiological information. If result is POSITIVE SARS-CoV-2 target nucleic acids are DETECTED. The SARS-CoV-2 RNA is generally detectable in upper and lower  respiratory specimens dur ing the acute phase of infection.  Positive  results are indicative of active infection with SARS-CoV-2.  Clinical  correlation with patient history and other diagnostic information is  necessary to determine patient infection status.  Positive results do  not rule out bacterial infection or co-infection with other viruses. If result is PRESUMPTIVE POSTIVE SARS-CoV-2 nucleic acids MAY BE PRESENT.   A presumptive positive result was obtained on the submitted specimen  and confirmed on repeat testing.  While 2019 novel coronavirus  (SARS-CoV-2)  nucleic acids may be present in the submitted sample  additional confirmatory testing may be necessary for epidemiological  and / or clinical management purposes  to differentiate between  SARS-CoV-2 and other Sarbecovirus currently known to infect humans.  If clinically indicated additional testing with an alternate test  methodology [phone]) is advised. The SARS-CoV-2 RNA is generally  detectable in upper and lower respiratory sp ecimens during the acute  phase of infection. The expected result is Negative. Fact Sheet for Patients:  StrictlyIdeas.no Fact Sheet for Healthcare Providers: BankingDealers.co.za This test is not yet approved or cleared by the [place] FDA and has been authorized for detection and/or diagnosis of SARS-CoV-2 by FDA under an Emergency Use Authorization (EUA).  This EUA will  remain in effect (meaning this test can be used) for the duration of the COVID-19 declaration under Section 564(b)(1) of the Act, 21 U.S.C. section 360bbb-3(b)(1), unless the authorization is terminated or revoked sooner. Performed at [facility], [address], [place], [place] [address]   MRSA PCR Screening     Status: None   Collection Time: 06/08/18 10:47 PM  Result Value Ref Range Status   MRSA by PCR NEGATIVE NEGATIVE Final    Comment:        The GeneXpert MRSA Assay (FDA approved for NASAL specimens only), is one component of a comprehensive MRSA colonization surveillance program. It is not intended to diagnose MRSA infection nor to guide or monitor treatment for MRSA infections. Performed at [facility], [address], [place], [place] [address]          Radiology Studies: No results found.      Scheduled Meds: . cloNIDine  0.1 mg Oral Q6H  . enoxaparin (LOVENOX) injection  60 mg Subcutaneous Q24H  . gabapentin  300 mg Oral TID  . hydrochlorothiazide  12.5 mg Oral Daily  . lisinopril  10 mg Oral Daily  . LORazepam  1 mg Intravenous QID  . methocarbamol  500 mg Oral QID  . PARoxetine  30 mg Oral Daily   Continuous Infusions: . sodium chloride 75 mL/hr at 06/09/18 0602  . dexmedetomidine (PRECEDEX) IV infusion 1 mcg/kg/hr (06/09/18 3419)     LOS: 0 days    Time spent: 30 minutes    Bria Sparr Darleen Crocker, DO [facility] Pager [phone]  If 7PM-7AM, please contact night-coverage [email or web address] Password [email or web address] 06/09/2018, 7:02 AM

## 2018-06-09 NOTE — Progress Notes (Signed)
On 06/08/2018 about [facility] and myself tried to place a foley catheter because pt was yelling out he could not use the bathroom and it was hurting. We were unable to get catheter to advance because we kept meeting resistance around the prostate area. Pt was able to void about 30 minutes after we attempted the catheterization and has throughout the night urinated about 1600cc.

## 2018-06-09 NOTE — Progress Notes (Signed)
Weaned patient to lowest amount of precedex patient is alert and oriented and able to communicate needs. Patient is tearful and states he didn't know that changing his medications would have done this to him and he is sorry for how he acted this RN reassured patient and told him we are here for him. I bladder scanned patient due to him not having any output up till now I got 443mL I asked him did he feel like he could urinate since he did last night and he said no he did not feel like he had to go and had no abdominal pain or discomfort. I told him I could place a foly cath but patient refused. Will continue to monitor and comfort patient.

## 2018-06-09 NOTE — Plan of Care (Signed)
Precedex drip

## 2018-06-10 ENCOUNTER — Inpatient Hospital Stay (HOSPITAL_COMMUNITY): Payer: Medicare HMO

## 2018-06-10 LAB — CBC
HCT: 35.8 % — ABNORMAL LOW (ref 39.0–52.0)
Hemoglobin: 11 g/dL — ABNORMAL LOW (ref 13.0–17.0)
MCH: 27.4 pg (ref 26.0–34.0)
MCHC: 30.7 g/dL (ref 30.0–36.0)
MCV: 89.3 fL (ref 80.0–100.0)
Platelets: 338 10*3/uL (ref 150–400)
RBC: 4.01 MIL/uL — ABNORMAL LOW (ref 4.22–5.81)
RDW: 14.3 % (ref 11.5–15.5)
WBC: 12.7 10*3/uL — ABNORMAL HIGH (ref 4.0–10.5)
nRBC: 0 % (ref 0.0–0.2)

## 2018-06-10 LAB — COMPREHENSIVE METABOLIC PANEL
ALT: 40 U/L (ref 0–44)
AST: 29 U/L (ref 15–41)
Albumin: 3.1 g/dL — ABNORMAL LOW (ref 3.5–5.0)
Alkaline Phosphatase: 151 U/L — ABNORMAL HIGH (ref 38–126)
Anion gap: 10 (ref 5–15)
BUN: 33 mg/dL — ABNORMAL HIGH (ref 6–20)
CO2: 24 mmol/L (ref 22–32)
Calcium: 8.4 mg/dL — ABNORMAL LOW (ref 8.9–10.3)
Chloride: 105 mmol/L (ref 98–111)
Creatinine, Ser: 1.87 mg/dL — ABNORMAL HIGH (ref 0.61–1.24)
GFR calc Af Amer: 45 mL/min — ABNORMAL LOW (ref >60)
GFR calc non Af Amer: 38 mL/min — ABNORMAL LOW (ref >60)
Glucose, Bld: 97 mg/dL (ref 70–99)
Potassium: 3.8 mmol/L (ref 3.5–5.1)
Sodium: 139 mmol/L (ref 135–145)
Total Bilirubin: 0.4 mg/dL (ref 0.3–1.2)
Total Protein: 6.4 g/dL — ABNORMAL LOW (ref 6.5–8.1)

## 2018-06-10 LAB — HEPATITIS PANEL, ACUTE
HCV Ab: <0.1 s/co ratio (ref 0.0–0.9)
Hep A IgM: NEGATIVE
Hep B C IgM: NEGATIVE
Hepatitis B Surface Ag: NEGATIVE

## 2018-06-10 LAB — HIV ANTIBODY (ROUTINE TESTING W REFLEX): HIV Screen 4th Generation wRfx: NONREACTIVE

## 2018-06-10 LAB — MAGNESIUM: Magnesium: 2.5 mg/dL — ABNORMAL HIGH (ref 1.7–2.4)

## 2018-06-10 MED ORDER — BUPRENORPHINE HCL-NALOXONE HCL 8-2 MG SL SUBL
1.0000 | SUBLINGUAL_TABLET | Freq: Two times a day (BID) | SUBLINGUAL | Status: DC
  Administered 2018-06-10 – 2018-06-11 (×3): 1 via SUBLINGUAL
  Filled 2018-06-10 (×3): qty 1

## 2018-06-10 MED ORDER — HEPARIN SODIUM (PORCINE) 5000 UNIT/ML IJ SOLN
5000.0000 [IU] | Freq: Three times a day (TID) | INTRAMUSCULAR | Status: DC
  Administered 2018-06-10 – 2018-06-11 (×4): 5000 [IU] via SUBCUTANEOUS
  Filled 2018-06-10 (×4): qty 1

## 2018-06-10 MED ORDER — SODIUM CHLORIDE 0.9 % IV SOLN
INTRAVENOUS | Status: DC
  Administered 2018-06-10 – 2018-06-11 (×2): via INTRAVENOUS

## 2018-06-10 MED ORDER — SODIUM CHLORIDE 0.9 % IV SOLN
INTRAVENOUS | Status: DC
  Administered 2018-06-10: 05:00:00 via INTRAVENOUS

## 2018-06-10 MED ORDER — CLONIDINE HCL 0.1 MG PO TABS: 0.1000 mg | ORAL_TABLET | Freq: Two times a day (BID) | ORAL | Status: DC

## 2018-06-10 NOTE — Progress Notes (Signed)
Pt has rested comfortably all shift. He is still running sinus brady and bp are soft. He has had no output the entire shift. He states he feels no urge to void. Bladder scan shows >200 in several areas. Explained to patient that typically we need patients to void at least once per shift. He does not want another attempt at an in and out cath. He is drinking water but very little. Strongly encouraged pt to attempt to void.

## 2018-06-10 NOTE — Progress Notes (Signed)
Wasted 55 mL of precedex into the stericycle jug with [place].

## 2018-06-10 NOTE — Progress Notes (Signed)
Paged Dr. Maudie Mercury concerned with soft BP's and low HR. Also expressed concern that patient still has not urinated. Ordered NS at 100 mL/hr. Will continue to monitor patient.

## 2018-06-10 NOTE — Progress Notes (Signed)
PROGRESS NOTE    DYLIN BREEDEN  OAC:[id number] DOB: 1960/04/16 DOA: 06/08/2018 PCP: Patient, No Pcp Per   Brief Narrative:  Per HPI: MatthewPintois a58 y.o.male,w hypertension, ADD, chronic back pain/lumbar radiculopathy apparently previously on methadone 100mg  po qday, stopped this 2 days ago and started suboxone this AM. Pt has had increase in pain as well as feeling shaky and loose stool, and anxious.   Patient was admitted with opioid withdrawal and was recently supposed to transition to Suboxone at which point he developed his symptoms.  He was also noted to have some hypertensive urgency.  He was transitioned to ICU overnight on (5/7) due to the need for Precedex drip.  He appears to be improving on the morning of 5/9 and has been weaned off Precedex drip.  He appears to have very little urine output and has been started on IV fluid.   Assessment & Plan:   Principal Problem:   Opioid withdrawal ([place]) Active Problems:   Hypertensive urgency   Lumbar radiculopathy, chronic   Anemia   Abnormal liver function   Narcotic withdrawal (HCC)  Opioid withdrawal-improving Wean off Precedex and transition to Ativan 1 mg 4 times daily Discontinue clonidine due to low blood pressures Start Hydroxyzine 25mg  po qid prn We will try to find out methadone/Suboxone clinic the patient is attending On discharge, please let Dr. Casimiro Needle know that the patient has been on methadone and suboxone   Chronic back pain/ lumbar radiculopathy-persistent Suboxone 8/2mg  twice daily Discontinue Toradol Start Robaxin 500mg  iv x1 then 500mg  po qid and q8h prn We will plan to talk to wife about Suboxone clinic, typically max dose is suboxone 8/2mg  sl tid  Hypertensive urgency-improved; now with soft blood pressures Clonidine 0.1mg  po qid discontinued Clonidine 0.1mg  po q6h prn sbp >160 Discontinue lisinopril and hydrochlorothiazide  Abnomal liver function (labs pending this am) Hepatitis  panel and right upper quadrant ultrasound unremarkable Repeat CMP demonstrates downward trend Check cmp in am  Anemia-stable Anemia panel within normal limits Check cbc in am  AKI Likely prerenal versus NSAID with recent use of Toradol-patient has been started on IV fluid this a.m. We will monitor strict I's and O's DC lisinopril and HCTZ and change Lovenox to heparin DC Toradol Renal ultrasound due to voiding difficulties   DVT prophylaxis: Lovenox to heparin today Code Status: Full Family Communication: We will plan to call wife later to confirm Suboxone dose Disposition Plan: Continue to treat opioid withdrawal and monitor bradycardia and blood pressure in stepdown unit.  Anticipate discharge in the next 24 to 48 hours if kidney function is improving and blood pressures are stable.   Consultants:   None  Procedures:   None  Antimicrobials:   None  Subjective: Patient seen and evaluated today with no new acute complaints or concerns. No acute concerns or events noted overnight.  He has had some difficulty voiding overnight and has developed some kidney injury.  He has minimal pain to his low back region that appears to be well controlled.  Objective: Vitals:   06/10/18 0400 06/10/18 0500 06/10/18 0516 06/10/18 0600  BP: (!) 87/47 (!) 91/50 (!) 91/50 (!) 83/47  Pulse: (!) 42 (!) 46  (!) 40  Resp: 14 13  15   Temp: 98.2 F (36.8 C)     TempSrc: Oral     SpO2: 96% 93%  96%  Weight: 117.7 kg     Height:        Intake/Output Summary (Last 24 hours) at  06/10/2018 0656 Last data filed at 06/09/2018 1504 Gross per 24 hour  Intake 221.69 ml  Output -  Net 221.69 ml   Filed Weights   06/08/18 2336 06/09/18 0500 06/10/18 0400  Weight: 118.1 kg 117.3 kg 117.7 kg    Examination:  General exam: Appears calm and comfortable  Respiratory system: Clear to auscultation. Respiratory effort normal. Cardiovascular system: S1 & S2 heard, RRR. No JVD, murmurs, rubs,  gallops or clicks. No pedal edema. Gastrointestinal system: Abdomen is nondistended, soft and nontender. No organomegaly or masses felt. Normal bowel sounds heard. Central nervous system: Alert and oriented. No focal neurological deficits. Extremities: Symmetric 5 x 5 power. Skin: No rashes, lesions or ulcers Psychiatry: Judgement and insight appear normal. Mood & affect appropriate.     Data Reviewed: I have personally reviewed following labs and imaging studies  CBC: Recent Labs  Lab 06/08/18 1815 06/09/18 0541 06/10/18 0434  WBC 11.3* 11.9* 12.7*  NEUTROABS 7.1  --   --   HGB 12.3* 12.5* 11.0*  HCT 39.6 39.3 35.8*  MCV 86.1 85.8 89.3  PLT 372 328 244   Basic Metabolic Panel: Recent Labs  Lab 06/08/18 1815 06/09/18 0541 06/10/18 0434  NA 135 136 139  K 4.0 3.8 3.8  CL 99 105 105  CO2 24 23 24   GLUCOSE 97 163* 97  BUN 24* 19 33*  CREATININE 1.14 1.01 1.87*  CALCIUM 9.1 8.6* 8.4*  MG  --   --  2.5*   GFR: Estimated Creatinine Clearance: 56.3 mL/min (A) (by C-G formula based on SCr of 1.87 mg/dL (H)). Liver Function Tests: Recent Labs  Lab 06/08/18 1815 06/09/18 0541 06/10/18 0434  AST 125* 63* 29  ALT 84* 60* 40  ALKPHOS 205* 194* 151*  BILITOT 0.8 0.5 0.4  PROT 7.9 7.2 6.4*  ALBUMIN 4.0 3.5 3.1*   No results for input(s): LIPASE, AMYLASE in the last 168 hours. No results for input(s): AMMONIA in the last 168 hours. Coagulation Profile: No results for input(s): INR, PROTIME in the last 168 hours. Cardiac Enzymes: No results for input(s): CKTOTAL, CKMB, CKMBINDEX, TROPONINI in the last 168 hours. BNP (last 3 results) No results for input(s): PROBNP in the last 8760 hours. HbA1C: No results for input(s): HGBA1C in the last 72 hours. CBG: Recent Labs  Lab 06/08/18 1856  GLUCAP 92   Lipid Profile: No results for input(s): CHOL, HDL, LDLCALC, TRIG, CHOLHDL, LDLDIRECT in the last 72 hours. Thyroid Function Tests: No results for input(s): TSH,  T4TOTAL, FREET4, T3FREE, THYROIDAB in the last 72 hours. Anemia Panel: Recent Labs    06/09/18 0541  VITAMINB12 419  FERRITIN 115  TIBC 341  IRON 87   Sepsis Labs: No results for input(s): PROCALCITON, LATICACIDVEN in the last 168 hours.  Recent Results (from the past 240 hour(s))  SARS Coronavirus 2 [facility] order, Performed in [facility] hospital lab)     Status: None   Collection Time: 06/08/18  8:40 PM  Result Value Ref Range Status   SARS Coronavirus 2 NEGATIVE NEGATIVE Final    Comment: (NOTE) If result is NEGATIVE SARS-CoV-2 target nucleic acids are NOT DETECTED. The SARS-CoV-2 RNA is generally detectable in upper and lower  respiratory specimens during the acute phase of infection. The lowest  concentration of SARS-CoV-2 viral copies this assay can detect is 250  copies / mL. A negative result does not preclude SARS-CoV-2 infection  and should not be used as the sole basis for treatment or other  patient management decisions.  A negative result may occur with  improper specimen collection / handling, submission of specimen other  than nasopharyngeal swab, presence of viral mutation(s) within the  areas targeted by this assay, and inadequate number of viral copies  (<250 copies / mL). A negative result must be combined with clinical  observations, patient history, and epidemiological information. If result is POSITIVE SARS-CoV-2 target nucleic acids are DETECTED. The SARS-CoV-2 RNA is generally detectable in upper and lower  respiratory specimens dur ing the acute phase of infection.  Positive  results are indicative of active infection with SARS-CoV-2.  Clinical  correlation with patient history and other diagnostic information is  necessary to determine patient infection status.  Positive results do  not rule out bacterial infection or co-infection with other viruses. If result is PRESUMPTIVE POSTIVE SARS-CoV-2 nucleic acids MAY BE PRESENT.   A presumptive  positive result was obtained on the submitted specimen  and confirmed on repeat testing.  While 2019 novel coronavirus  (SARS-CoV-2) nucleic acids may be present in the submitted sample  additional confirmatory testing may be necessary for epidemiological  and / or clinical management purposes  to differentiate between  SARS-CoV-2 and other Sarbecovirus currently known to infect humans.  If clinically indicated additional testing with an alternate test  methodology [phone]) is advised. The SARS-CoV-2 RNA is generally  detectable in upper and lower respiratory sp ecimens during the acute  phase of infection. The expected result is Negative. Fact Sheet for Patients:  StrictlyIdeas.no Fact Sheet for Healthcare Providers: BankingDealers.co.za This test is not yet approved or cleared by the [place] FDA and has been authorized for detection and/or diagnosis of SARS-CoV-2 by FDA under an Emergency Use Authorization (EUA).  This EUA will remain in effect (meaning this test can be used) for the duration of the COVID-19 declaration under Section 564(b)(1) of the Act, 21 U.S.C. section 360bbb-3(b)(1), unless the authorization is terminated or revoked sooner. Performed at [facility], [address], [place], [place] [address]   MRSA PCR Screening     Status: None   Collection Time: 06/08/18 10:47 PM  Result Value Ref Range Status   MRSA by PCR NEGATIVE NEGATIVE Final    Comment:        The GeneXpert MRSA Assay (FDA approved for NASAL specimens only), is one component of a comprehensive MRSA colonization surveillance program. It is not intended to diagnose MRSA infection nor to guide or monitor treatment for MRSA infections. Performed at [facility], [address] [place], [place] [address]          Radiology Studies: US Abdomen Limited Ruq  Result Date: 06/09/2018 CLINICAL DATA:  58 year old male with abnormal LFTs EXAM:  ULTRASOUND ABDOMEN LIMITED RIGHT UPPER QUADRANT COMPARISON:  None. FINDINGS: Gallbladder: No gallstones or wall thickening visualized. No sonographic Murphy sign noted by sonographer. Common bile duct: Diameter: 4 mm-5 mm Liver: Heterogeneous echotexture of the liver parenchyma with no nodular contour. Portal vein is patent on color Doppler imaging with normal direction of blood flow towards the liver. IMPRESSION: Unremarkable appearance of the gallbladder. Heterogeneous appearance of liver, potentially steatosis or other medical liver disease. Electronically Signed   By: Corrie Mckusick D.O.   On: 06/09/2018 09:12        Scheduled Meds: . Chlorhexidine Gluconate Cloth  6 each Topical Daily  . cloNIDine  0.1 mg Oral Q6H  . enoxaparin (LOVENOX) injection  60 mg Subcutaneous Q24H  . gabapentin  300 mg Oral TID  .  methocarbamol  500 mg Oral QID  . PARoxetine  30 mg Oral Daily   Continuous Infusions: . sodium chloride 100 mL/hr at 06/10/18 0430  . dexmedetomidine (PRECEDEX) IV infusion Stopped (06/09/18 1600)     LOS: 1 day    Time spent: 30 minutes    Alvie Speltz Darleen Crocker, DO [facility] Pager [phone]  If 7PM-7AM, please contact night-coverage [email or web address] Password [facility] 06/10/2018, 6:56 AM

## 2018-06-10 NOTE — Progress Notes (Signed)
Located and provided patient with his eyeglasses, card from family, and notepad that was brought for him earlier.

## 2018-06-11 LAB — COMPREHENSIVE METABOLIC PANEL
ALT: 30 U/L (ref 0–44)
AST: 18 U/L (ref 15–41)
Albumin: 3 g/dL — ABNORMAL LOW (ref 3.5–5.0)
Alkaline Phosphatase: 140 U/L — ABNORMAL HIGH (ref 38–126)
Anion gap: 9 (ref 5–15)
BUN: 28 mg/dL — ABNORMAL HIGH (ref 6–20)
CO2: 23 mmol/L (ref 22–32)
Calcium: 8.3 mg/dL — ABNORMAL LOW (ref 8.9–10.3)
Chloride: 108 mmol/L (ref 98–111)
Creatinine, Ser: 1.23 mg/dL (ref 0.61–1.24)
GFR calc Af Amer: >60 mL/min (ref >60)
GFR calc non Af Amer: >60 mL/min (ref >60)
Glucose, Bld: 96 mg/dL (ref 70–99)
Potassium: 3.4 mmol/L — ABNORMAL LOW (ref 3.5–5.1)
Sodium: 140 mmol/L (ref 135–145)
Total Bilirubin: 0.3 mg/dL (ref 0.3–1.2)
Total Protein: 6.6 g/dL (ref 6.5–8.1)

## 2018-06-11 MED ORDER — POTASSIUM CHLORIDE CRYS ER 20 MEQ PO TBCR
40.0000 meq | EXTENDED_RELEASE_TABLET | Freq: Once | ORAL | Status: AC
  Administered 2018-06-11: 08:00:00 40 meq via ORAL
  Filled 2018-06-11: qty 2

## 2018-06-11 MED ORDER — METHOCARBAMOL 500 MG PO TABS: 500.0000 mg | ORAL_TABLET | Freq: Three times a day (TID) | ORAL | 0 refills | Status: AC | PRN

## 2018-06-11 MED ORDER — GABAPENTIN 300 MG PO CAPS: 300.0000 mg | ORAL_CAPSULE | Freq: Three times a day (TID) | ORAL | 0 refills | Status: DC

## 2018-06-11 NOTE — Discharge Summary (Signed)
Physician Discharge Summary  Patrick Cruz GDJ:[id number] DOB: 1960-03-03 DOA: 06/08/2018  PCP: Patient, No Pcp Per  Admit date: 06/08/2018  Discharge date: 06/11/2018  Admitted From:Home  Disposition:  Home  Recommendations for Outpatient Follow-up:  1. Follow up with PCP in 1-2 weeks 2. Please obtain BMP in one week to reassess kidney function and potassium levels 3. Continue home medications as prior and patient does have Suboxone 8/2 mg twice daily available to him at home and has been tolerating this well here. 4. Continue usual blood pressure medications at home  Home Health: None  Equipment/Devices: None  Discharge Condition: Stable  CODE STATUS: Full  Diet recommendation: Heart Healthy  Brief/Interim Summary: Per HPI: MatthewPintois a57 y.o.male,w hypertension, ADD, chronic back pain/lumbar radiculopathy apparently previously on methadone 100mg  po qday, stopped this 2 days ago and started suboxone this AM. Pt has had increase in pain as well as feeling shaky and loose stool, and anxious.  Patient was admitted with opioid withdrawal and was recently supposed to transition to Suboxone at which point he developed his symptoms. He was also noted to have some hypertensive urgency. He was transitioned to ICU overnight on (5/7) due to the need for Precedex drip.  He appears to be improving on the morning of 5/9 and has been weaned off Precedex drip.  He was noted to have some kidney injury and has undergone renal ultrasound with no acute findings noted.  Kidney function is improved after IV fluid hydration and he will need subsequent follow-up of his BMP in 1 week with his PCP.  He will continue on his home Suboxone dose twice daily and his opioid withdrawal symptoms have now fully resolved.  He is otherwise stable for discharge at this point to continue Suboxone under the supervision of his pain specialist.  He will be given some Robaxin for muscle spasms as  well.  Discharge Diagnoses:  Principal Problem:   Opioid withdrawal ([place]) Active Problems:   Hypertensive urgency   Lumbar radiculopathy, chronic   Anemia   Abnormal liver function   Narcotic withdrawal ([place])  Principal discharge diagnosis: Opioid withdrawal with associated hypertensive urgency.  Discharge Instructions  Discharge Instructions    Diet - low sodium heart healthy   Complete by:  As directed    Increase activity slowly   Complete by:  As directed      Allergies as of 06/11/2018   No Known Allergies     Medication List    STOP taking these medications   ibuprofen 800 MG tablet Commonly known as:  ADVIL   oxyCODONE-acetaminophen 5-325 MG tablet Commonly known as:  PERCOCET/ROXICET     TAKE these medications   amphetamine-dextroamphetamine 30 MG 24 hr capsule Commonly known as:  ADDERALL XR Take 30 mg by mouth 3 (three) times daily.   clonazePAM 1 MG tablet Commonly known as:  KLONOPIN Take 1 mg by mouth 2 (two) times daily.   gabapentin 300 MG capsule Commonly known as:  NEURONTIN Take 300 mg by mouth 3 (three) times daily.   hydrochlorothiazide 12.5 MG tablet Commonly known as:  HYDRODIURIL Take 1 tablet (12.5 mg total) by mouth daily.   lisinopril 10 MG tablet Commonly known as:  ZESTRIL Take 10 mg by mouth daily.   methocarbamol 500 MG tablet Commonly known as:  ROBAXIN Take 1 tablet (500 mg total) by mouth every 8 (eight) hours as needed for up to 10 days for muscle spasms.   ondansetron 4 MG disintegrating tablet Commonly  known as:  Zofran ODT Take 1 tablet (4 mg total) by mouth every 6 (six) hours as needed.   PARoxetine 30 MG tablet Commonly known as:  PAXIL Take 30 mg by mouth 2 (two) times daily.      Follow-up Information    pcp Follow up in 1 week(s).   Why:  Recheck bmp during visit.         No Known Allergies  Consultations:  None   Procedures/Studies: US Renal  Result Date: 06/10/2018 CLINICAL DATA:   Acute renal failure EXAM: RENAL / URINARY TRACT ULTRASOUND COMPLETE COMPARISON:  None. FINDINGS: Right Kidney: Renal measurements: 12.8 x 6.2 x 6.6 cm = volume: 275 mL . Echogenicity within normal limits. No mass or hydronephrosis visualized. Left Kidney: Renal measurements: 3.0 x 5.6 x 5.2 cm = volume: 198 mL. Small anechoic cyst cortex measuring 1.5 cm. No hydronephrosis Bladder: Distended bladder.  Bilateral ureteral jets demonstrated. IMPRESSION: 1. No hydronephrosis. 2. Bilateral ureteral jets demonstrated.  Bladder mildly distended. Electronically Signed   By: Suzy Bouchard M.D.   On: 06/10/2018 10:27   US Abdomen Limited Ruq  Result Date: 06/09/2018 CLINICAL DATA:  58 year old male with abnormal LFTs EXAM: ULTRASOUND ABDOMEN LIMITED RIGHT UPPER QUADRANT COMPARISON:  None. FINDINGS: Gallbladder: No gallstones or wall thickening visualized. No sonographic Murphy sign noted by sonographer. Common bile duct: Diameter: 4 mm-5 mm Liver: Heterogeneous echotexture of the liver parenchyma with no nodular contour. Portal vein is patent on color Doppler imaging with normal direction of blood flow towards the liver. IMPRESSION: Unremarkable appearance of the gallbladder. Heterogeneous appearance of liver, potentially steatosis or other medical liver disease. Electronically Signed   By: Corrie Mckusick D.O.   On: 06/09/2018 09:12     Discharge Exam: Vitals:   06/11/18 0500 06/11/18 0600  BP: (!) 157/64 (!) 161/68  Pulse: (!) 44 (!) 48  Resp: 16 16  Temp:    SpO2: 94% 94%   Vitals:   06/11/18 0300 06/11/18 0400 06/11/18 0500 06/11/18 0600  BP: (!) 147/63 (!) 154/68 (!) 157/64 (!) 161/68  Pulse: (!) 47 (!) 49 (!) 44 (!) 48  Resp: 18 16 16 16   Temp:      TempSrc:      SpO2: 94% 94% 94% 94%  Weight:   118.2 kg   Height:        General: Pt is alert, awake, not in acute distress Cardiovascular: RRR, S1/S2 +, no rubs, no gallops Respiratory: CTA bilaterally, no wheezing, no rhonchi Abdominal:  Soft, NT, ND, bowel sounds + Extremities: no edema, no cyanosis    The results of significant diagnostics from this hospitalization (including imaging, microbiology, ancillary and laboratory) are listed below for reference.     Microbiology: Recent Results (from the past 240 hour(s))  SARS Coronavirus 2 [facility] order, Performed in [facility] hospital lab)     Status: None   Collection Time: 06/08/18  8:40 PM  Result Value Ref Range Status   SARS Coronavirus 2 NEGATIVE NEGATIVE Final    Comment: (NOTE) If result is NEGATIVE SARS-CoV-2 target nucleic acids are NOT DETECTED. The SARS-CoV-2 RNA is generally detectable in upper and lower  respiratory specimens during the acute phase of infection. The lowest  concentration of SARS-CoV-2 viral copies this assay can detect is 250  copies / mL. A negative result does not preclude SARS-CoV-2 infection  and should not be used as the sole basis for treatment or other  patient management decisions.  A negative result  may occur with  improper specimen collection / handling, submission of specimen other  than nasopharyngeal swab, presence of viral mutation(s) within the  areas targeted by this assay, and inadequate number of viral copies  (<250 copies / mL). A negative result must be combined with clinical  observations, patient history, and epidemiological information. If result is POSITIVE SARS-CoV-2 target nucleic acids are DETECTED. The SARS-CoV-2 RNA is generally detectable in upper and lower  respiratory specimens dur ing the acute phase of infection.  Positive  results are indicative of active infection with SARS-CoV-2.  Clinical  correlation with patient history and other diagnostic information is  necessary to determine patient infection status.  Positive results do  not rule out bacterial infection or co-infection with other viruses. If result is PRESUMPTIVE POSTIVE SARS-CoV-2 nucleic acids MAY BE PRESENT.   A presumptive  positive result was obtained on the submitted specimen  and confirmed on repeat testing.  While 2019 novel coronavirus  (SARS-CoV-2) nucleic acids may be present in the submitted sample  additional confirmatory testing may be necessary for epidemiological  and / or clinical management purposes  to differentiate between  SARS-CoV-2 and other Sarbecovirus currently known to infect humans.  If clinically indicated additional testing with an alternate test  methodology [phone]) is advised. The SARS-CoV-2 RNA is generally  detectable in upper and lower respiratory sp ecimens during the acute  phase of infection. The expected result is Negative. Fact Sheet for Patients:  StrictlyIdeas.no Fact Sheet for Healthcare Providers: BankingDealers.co.za This test is not yet approved or cleared by the [place] FDA and has been authorized for detection and/or diagnosis of SARS-CoV-2 by FDA under an Emergency Use Authorization (EUA).  This EUA will remain in effect (meaning this test can be used) for the duration of the COVID-19 declaration under Section 564(b)(1) of the Act, 21 U.S.C. section 360bbb-3(b)(1), unless the authorization is terminated or revoked sooner. Performed at [facility], [address] [place], [place] [address]   MRSA PCR Screening     Status: None   Collection Time: 06/08/18 10:47 PM  Result Value Ref Range Status   MRSA by PCR NEGATIVE NEGATIVE Final    Comment:        The GeneXpert MRSA Assay (FDA approved for NASAL specimens only), is one component of a comprehensive MRSA colonization surveillance program. It is not intended to diagnose MRSA infection nor to guide or monitor treatment for MRSA infections. Performed at [facility], [address] [place], [place] [address]      Labs: BNP (last 3 results) No results for input(s): BNP in the last 8760 hours. Basic Metabolic Panel: Recent Labs  Lab 06/08/18 1815  06/09/18 0541 06/10/18 0434 06/11/18 0419  NA 135 136 139 140  K 4.0 3.8 3.8 3.4*  CL 99 105 105 108  CO2 24 23 24 23   GLUCOSE 97 163* 97 96  BUN 24* 19 33* 28*  CREATININE 1.14 1.01 1.87* 1.23  CALCIUM 9.1 8.6* 8.4* 8.3*  MG  --   --  2.5*  --    Liver Function Tests: Recent Labs  Lab 06/08/18 1815 06/09/18 0541 06/10/18 0434 06/11/18 0419  AST 125* 63* 29 18  ALT 84* 60* 40 30  ALKPHOS 205* 194* 151* 140*  BILITOT 0.8 0.5 0.4 0.3  PROT 7.9 7.2 6.4* 6.6  ALBUMIN 4.0 3.5 3.1* 3.0*   No results for input(s): LIPASE, AMYLASE in the last 168 hours. No results for input(s): AMMONIA in the last  168 hours. CBC: Recent Labs  Lab 06/08/18 1815 06/09/18 0541 06/10/18 0434  WBC 11.3* 11.9* 12.7*  NEUTROABS 7.1  --   --   HGB 12.3* 12.5* 11.0*  HCT 39.6 39.3 35.8*  MCV 86.1 85.8 89.3  PLT 372 328 338   Cardiac Enzymes: No results for input(s): CKTOTAL, CKMB, CKMBINDEX, TROPONINI in the last 168 hours. BNP: Invalid input(s): POCBNP CBG: Recent Labs  Lab 06/08/18 1856  GLUCAP 92   D-Dimer No results for input(s): DDIMER in the last 72 hours. Hgb A1c No results for input(s): HGBA1C in the last 72 hours. Lipid Profile No results for input(s): CHOL, HDL, LDLCALC, TRIG, CHOLHDL, LDLDIRECT in the last 72 hours. Thyroid function studies No results for input(s): TSH, T4TOTAL, T3FREE, THYROIDAB in the last 72 hours.  Invalid input(s): FREET3 Anemia work up Recent Labs    06/09/18 0541  VITAMINB12 419  FERRITIN 115  TIBC 341  IRON 87   Urinalysis No results found for: COLORURINE, APPEARANCEUR, LABSPEC, [place], GLUCOSEU, [place], [place], KETONESUR, PROTEINUR, UROBILINOGEN, NITRITE, LEUKOCYTESUR Sepsis Labs Invalid input(s): PROCALCITONIN,  WBC,  [facility] Recent Results (from the past 240 hour(s))  SARS Coronavirus 2 [facility] order, Performed in [place] hospital lab)     Status: None   Collection Time: 06/08/18  8:40 PM  Result Value  Ref Range Status   SARS Coronavirus 2 NEGATIVE NEGATIVE Final    Comment: (NOTE) If result is NEGATIVE SARS-CoV-2 target nucleic acids are NOT DETECTED. The SARS-CoV-2 RNA is generally detectable in upper and lower  respiratory specimens during the acute phase of infection. The lowest  concentration of SARS-CoV-2 viral copies this assay can detect is 250  copies / mL. A negative result does not preclude SARS-CoV-2 infection  and should not be used as the sole basis for treatment or other  patient management decisions.  A negative result may occur with  improper specimen collection / handling, submission of specimen other  than nasopharyngeal swab, presence of viral mutation(s) within the  areas targeted by this assay, and inadequate number of viral copies  (<250 copies / mL). A negative result must be combined with clinical  observations, patient history, and epidemiological information. If result is POSITIVE SARS-CoV-2 target nucleic acids are DETECTED. The SARS-CoV-2 RNA is generally detectable in upper and lower  respiratory specimens dur ing the acute phase of infection.  Positive  results are indicative of active infection with SARS-CoV-2.  Clinical  correlation with patient history and other diagnostic information is  necessary to determine patient infection status.  Positive results do  not rule out bacterial infection or co-infection with other viruses. If result is PRESUMPTIVE POSTIVE SARS-CoV-2 nucleic acids MAY BE PRESENT.   A presumptive positive result was obtained on the submitted specimen  and confirmed on repeat testing.  While 2019 novel coronavirus  (SARS-CoV-2) nucleic acids may be present in the submitted sample  additional confirmatory testing may be necessary for epidemiological  and / or clinical management purposes  to differentiate between  SARS-CoV-2 and other Sarbecovirus currently known to infect humans.  If clinically indicated additional testing with an  alternate test  methodology [phone]) is advised. The SARS-CoV-2 RNA is generally  detectable in upper and lower respiratory sp ecimens during the acute  phase of infection. The expected result is Negative. Fact Sheet for Patients:  StrictlyIdeas.no Fact Sheet for Healthcare Providers: BankingDealers.co.za This test is not yet approved or cleared by the [place] FDA and has been authorized for detection  and/or diagnosis of SARS-CoV-2 by FDA under an Emergency Use Authorization (EUA).  This EUA will remain in effect (meaning this test can be used) for the duration of the COVID-19 declaration under Section 564(b)(1) of the Act, 21 U.S.C. section 360bbb-3(b)(1), unless the authorization is terminated or revoked sooner. Performed at [facility], [address], [place], [place] [address]   MRSA PCR Screening     Status: None   Collection Time: 06/08/18 10:47 PM  Result Value Ref Range Status   MRSA by PCR NEGATIVE NEGATIVE Final    Comment:        The GeneXpert MRSA Assay (FDA approved for NASAL specimens only), is one component of a comprehensive MRSA colonization surveillance program. It is not intended to diagnose MRSA infection nor to guide or monitor treatment for MRSA infections. Performed at [facility], [address], [place], [place] [address]      Time coordinating discharge: 35 minutes  SIGNED:   Rodena Goldmann, DO [facility] 06/11/2018, 7:02 AM  If 7PM-7AM, please contact night-coverage [email or web address] Password [email or web address]

## 2018-06-11 NOTE — Progress Notes (Signed)
Patient alert and oriented x4. No complaints of pain, shortness of breath, chest pain, dizziness, nausea or vomiting. Patient up out of bed with supervision to commode, gait steady. IV's removed without complications. Patient tolerated PO meds and diet well. Appetite fair. Patient tearful at times stating, "I just want to go home. I miss my wife." Emotional support provided. Discharge instructions and medication education done with patient. Patient expressed full understanding. Spoke with wife over the phone who is coming to pick patient up. Patient discharged with all belongings for home via car (wife driving patient home).

## 2018-06-12 LAB — FOLATE RBC
Folate, Hemolysate: 398 ng/mL
Folate, RBC: 1109 ng/mL (ref >498)
Hematocrit: 35.9 % — ABNORMAL LOW (ref 37.5–51.0)

## 2018-06-13 ENCOUNTER — Encounter (HOSPITAL_COMMUNITY): Payer: Self-pay | Admitting: Emergency Medicine

## 2018-06-13 ENCOUNTER — Other Ambulatory Visit: Payer: Self-pay

## 2018-06-13 ENCOUNTER — Ambulatory Visit (HOSPITAL_COMMUNITY)
Admission: EM | Admit: 2018-06-13 | Discharge: 2018-06-13 | Disposition: A | Discharge status: Home / Self Care | Payer: Medicare HMO | Attending: Family Medicine | Admitting: Family Medicine

## 2018-06-13 DIAGNOSIS — G894 Chronic pain syndrome: Secondary | ICD-10-CM

## 2018-06-13 DIAGNOSIS — M4856XG Collapsed vertebra, not elsewhere classified, lumbar region, subsequent encounter for fracture with delayed healing: Secondary | ICD-10-CM | POA: Diagnosis not present

## 2018-06-13 DIAGNOSIS — F1120 Opioid dependence, uncomplicated: Secondary | ICD-10-CM | POA: Diagnosis not present

## 2018-06-13 DIAGNOSIS — M5489 Other dorsalgia: Secondary | ICD-10-CM

## 2018-06-13 DIAGNOSIS — Z79891 Long term (current) use of opiate analgesic: Secondary | ICD-10-CM | POA: Diagnosis not present

## 2018-06-13 DIAGNOSIS — M545 Low back pain: Secondary | ICD-10-CM | POA: Diagnosis not present

## 2018-06-13 DIAGNOSIS — M4306 Spondylolysis, lumbar region: Secondary | ICD-10-CM | POA: Diagnosis not present

## 2018-06-13 DIAGNOSIS — G8929 Other chronic pain: Secondary | ICD-10-CM | POA: Diagnosis not present

## 2018-06-13 MED ORDER — KETOROLAC TROMETHAMINE 60 MG/2ML IM SOLN
60.0000 mg | Freq: Once | INTRAMUSCULAR | Status: AC
  Administered 2018-06-13: 17:00:00 60 mg via INTRAMUSCULAR

## 2018-06-13 MED ORDER — PREDNISONE 20 MG PO TABS: ORAL_TABLET | ORAL | 0 refills | Status: DC

## 2018-06-13 MED ORDER — KETOROLAC TROMETHAMINE 60 MG/2ML IM SOLN
INTRAMUSCULAR | Status: AC
  Filled 2018-06-13: qty 2

## 2018-06-13 NOTE — Discharge Instructions (Signed)
Please call [facility] to schedule an appointment.

## 2018-06-13 NOTE — ED Triage Notes (Signed)
Pt c/o back pain, hx of back problems, sees a pain specialist. States he just left the pain clinic and they said they were not able to help him.

## 2018-06-13 NOTE — ED Provider Notes (Signed)
[place]    CSN: [id number] Arrival date & time: 06/13/18  1557     History   Chief Complaint Chief Complaint  Patient presents with  . Back Pain    HPI Patrick Cruz is a 58 y.o. male.   Initial [place] visit for this 58 yo man. Pt c/o back pain, hx of back problems, sees a pain specialist. States he just left the pain clinic and they said they were not able to help him.   He was discharged from [facility] two days ago for withdrawal, 2 days ago and is not happy with the Suboxone he was prescribed.  He had been on chronic methadone.  Past films reviewed.      Past Medical History:  Diagnosis Date  . Arthritis   . Chronic back pain     Patient Active Problem List   Diagnosis Date Noted  . Narcotic withdrawal ([place]) 06/09/2018  . Hypertensive urgency 06/08/2018  . Opioid withdrawal ([place]) 06/08/2018  . Lumbar radiculopathy, chronic 06/08/2018  . Anemia 06/08/2018  . Abnormal liver function 06/08/2018    Past Surgical History:  Procedure Laterality Date  . KNEE ARTHROSCOPY W/ DEBRIDEMENT         Home Medications    Prior to Admission medications   Medication Sig Start Date End Date Taking? Authorizing Provider  buprenorphine-naloxone (SUBOXONE) 8-2 mg SUBL SL tablet Place 1 tablet under the tongue daily.   Yes [provider]  amphetamine-dextroamphetamine (ADDERALL XR) 30 MG 24 hr capsule Take 30 mg by mouth 3 (three) times daily. 01/01/18   [provider]  clonazePAM (KLONOPIN) 1 MG tablet Take 1 mg by mouth 2 (two) times daily. 12/07/17   [provider]  gabapentin (NEURONTIN) 300 MG capsule Take 1 capsule (300 mg total) by mouth 3 (three) times daily for 30 days. 06/11/18 07/11/18  Manuella Ghazi, Pratik D, DO  hydrochlorothiazide (HYDRODIURIL) 12.5 MG tablet Take 1 tablet (12.5 mg total) by mouth daily. 01/10/18   Hedges, Dellis Filbert, PA-C  lisinopril (ZESTRIL) 10 MG tablet Take 10 mg by mouth daily. 05/08/18   [provider]  methocarbamol (ROBAXIN) 500 MG tablet Take 1 tablet (500 mg total) by mouth every 8 (eight) hours as needed for up to 10 days for muscle spasms. 06/11/18 06/21/18  Manuella Ghazi, Pratik D, DO  ondansetron (ZOFRAN ODT) 4 MG disintegrating tablet Take 1 tablet (4 mg total) by mouth every 6 (six) hours as needed. 01/12/18   Ward, Delice Bison, DO  PARoxetine (PAXIL) 30 MG tablet Take 30 mg by mouth 2 (two) times daily. 10/25/17   [provider]  predniSONE (DELTASONE) 20 MG tablet Two daily with food 06/13/18   Robyn Haber, MD    Family History Family History  Problem Relation Age of Onset  . Obesity Other     Social History Social History   Tobacco Use  . Smoking status: Never Smoker  . Smokeless tobacco: Never Used  Substance Use Topics  . Alcohol use: Never    Frequency: Never  . Drug use: Not Currently     Allergies   Patient has no known allergies.   Review of Systems Review of Systems   Physical Exam Triage Vital Signs ED Triage Vitals  Enc Vitals Group     BP 06/13/18 1627 (!) 182/133     Pulse Rate 06/13/18 1627 94     Resp 06/13/18 1627 16     Temp 06/13/18 1627 97.9 F (36.6 C)  Temp src --      SpO2 06/13/18 1627 95 %     Weight --      Height --      Head Circumference --      Peak Flow --      Pain Score 06/13/18 1629 10     Pain Loc --      Pain Edu? --      Excl. in [place]? --    No data found.  Updated Vital Signs BP (!) 182/133   Pulse 94   Temp 97.9 F (36.6 C)   Resp 16   SpO2 95%    Physical Exam Vitals signs and nursing note reviewed.  Pulmonary:     Effort: Pulmonary effort is normal.  Skin:    General: Skin is warm and dry.  Neurological:     Mental Status: He is alert.     Gait: Gait abnormal.  Psychiatric:     Comments: Crying during interview      UC Treatments / Results  Labs (all labs ordered are listed, but only abnormal results are displayed) Labs Reviewed - No data to display  EKG None  Radiology No  results found.  Procedures Procedures (including critical care time)  Medications Ordered in UC Medications  ketorolac (TORADOL) injection 60 mg (has no administration in time range)    Initial Impression / Assessment and Plan / UC Course  I have reviewed the triage vital signs and the nursing notes.  Pertinent labs & imaging results that were available during my care of the patient were reviewed by me and considered in my medical decision making (see chart for details).    Final Clinical Impressions(s) / UC Diagnoses   Final diagnoses:  Chronic pain syndrome     Discharge Instructions     Please call [place] [facility] to schedule an appointment.      ED Prescriptions    Medication Sig Dispense Auth. Provider   predniSONE (DELTASONE) 20 MG tablet Two daily with food 10 tablet Robyn Haber, MD     Controlled Substance Prescriptions  Controlled Substance Registry consulted? Not Applicable   Robyn Haber, MD 06/13/18 1654

## 2018-06-15 ENCOUNTER — Emergency Department (HOSPITAL_COMMUNITY): Payer: Medicare HMO

## 2018-06-15 ENCOUNTER — Emergency Department (HOSPITAL_COMMUNITY)
Admission: EM | Admit: 2018-06-15 | Discharge: 2018-06-15 | Disposition: A | Discharge status: Home / Self Care | Payer: Medicare HMO | Attending: Emergency Medicine | Admitting: Emergency Medicine

## 2018-06-15 ENCOUNTER — Encounter (HOSPITAL_COMMUNITY): Payer: Self-pay | Admitting: Emergency Medicine

## 2018-06-15 ENCOUNTER — Other Ambulatory Visit: Payer: Self-pay

## 2018-06-15 DIAGNOSIS — Z79899 Other long term (current) drug therapy: Secondary | ICD-10-CM | POA: Diagnosis not present

## 2018-06-15 DIAGNOSIS — G8929 Other chronic pain: Secondary | ICD-10-CM

## 2018-06-15 DIAGNOSIS — I10 Essential (primary) hypertension: Secondary | ICD-10-CM | POA: Insufficient documentation

## 2018-06-15 DIAGNOSIS — R0789 Other chest pain: Secondary | ICD-10-CM | POA: Diagnosis not present

## 2018-06-15 DIAGNOSIS — M545 Low back pain, unspecified: Secondary | ICD-10-CM

## 2018-06-15 DIAGNOSIS — R079 Chest pain, unspecified: Secondary | ICD-10-CM | POA: Diagnosis not present

## 2018-06-15 LAB — CBC WITH DIFFERENTIAL/PLATELET
Abs Immature Granulocytes: 0.1 10*3/uL — ABNORMAL HIGH (ref 0.00–0.07)
Basophils Absolute: 0 10*3/uL (ref 0.0–0.1)
Basophils Relative: 0 %
Eosinophils Absolute: 0.2 10*3/uL (ref 0.0–0.5)
Eosinophils Relative: 2 %
HCT: 39.8 % (ref 39.0–52.0)
Hemoglobin: 12.6 g/dL — ABNORMAL LOW (ref 13.0–17.0)
Immature Granulocytes: 1 %
Lymphocytes Relative: 32 %
Lymphs Abs: 4 10*3/uL (ref 0.7–4.0)
MCH: 27.5 pg (ref 26.0–34.0)
MCHC: 31.7 g/dL (ref 30.0–36.0)
MCV: 86.9 fL (ref 80.0–100.0)
Monocytes Absolute: 1 10*3/uL (ref 0.1–1.0)
Monocytes Relative: 8 %
Neutro Abs: 7.3 10*3/uL (ref 1.7–7.7)
Neutrophils Relative %: 57 %
Platelets: 315 10*3/uL (ref 150–400)
RBC: 4.58 MIL/uL (ref 4.22–5.81)
RDW: 13.9 % (ref 11.5–15.5)
WBC: 12.6 10*3/uL — ABNORMAL HIGH (ref 4.0–10.5)
nRBC: 0 % (ref 0.0–0.2)

## 2018-06-15 LAB — COMPREHENSIVE METABOLIC PANEL
ALT: 24 U/L (ref 0–44)
AST: 16 U/L (ref 15–41)
Albumin: 3.5 g/dL (ref 3.5–5.0)
Alkaline Phosphatase: 132 U/L — ABNORMAL HIGH (ref 38–126)
Anion gap: 10 (ref 5–15)
BUN: 18 mg/dL (ref 6–20)
CO2: 26 mmol/L (ref 22–32)
Calcium: 9.2 mg/dL (ref 8.9–10.3)
Chloride: 102 mmol/L (ref 98–111)
Creatinine, Ser: 1.24 mg/dL (ref 0.61–1.24)
GFR calc Af Amer: >60 mL/min (ref >60)
GFR calc non Af Amer: >60 mL/min (ref >60)
Glucose, Bld: 112 mg/dL — ABNORMAL HIGH (ref 70–99)
Potassium: 4 mmol/L (ref 3.5–5.1)
Sodium: 138 mmol/L (ref 135–145)
Total Bilirubin: 0.2 mg/dL — ABNORMAL LOW (ref 0.3–1.2)
Total Protein: 7 g/dL (ref 6.5–8.1)

## 2018-06-15 LAB — TROPONIN I: Troponin I: <0.03 ng/mL (ref <0.03)

## 2018-06-15 MED ORDER — KETOROLAC TROMETHAMINE 30 MG/ML IJ SOLN
15.0000 mg | Freq: Once | INTRAMUSCULAR | Status: DC
  Filled 2018-06-15: qty 1

## 2018-06-15 NOTE — ED Notes (Signed)
Patient verbalizes understanding of discharge instructions . Opportunity for questions and answers were provided . Armband removed by staff ,Pt discharged from ED. W/C  offered at D/C  and Declined W/C at D/C and was escorted to lobby by RN.  

## 2018-06-15 NOTE — ED Triage Notes (Signed)
PT tol. PO liquids with out problems

## 2018-06-15 NOTE — ED Provider Notes (Signed)
[facility] EMERGENCY DEPARTMENT Provider Note   CSN: [id number] Arrival date & time: 06/15/18  1126    History   Chief Complaint Chief Complaint  Patient presents with   Back Pain    HPI Patrick Cruz is a 58 y.o. male with a past medical history of hypertension, anemia, chronic back pain, who presents today for evaluation of worsening back pain.  He reports that his pain is in the usual location and feels the same however is significantly worse than normal.  He denies any changes to bowel or bladder function.  No changes in sensation.  He denies any recent fevers.  He was recently admitted at [facility] from 5-7-202 until -06-11-2018.  He was admitted for acute narcotic withdrawal, he had been on methadone for approximately 14 years and his pain specialist tried to switch him off methadone and onto Suboxone.  He was admitted into the ICU on a Precedex drip for severe withdrawal.  He was seen at [facility] urgent care on 06/13/2018 for his chronic pain where he was treated with Toradol injection and discharged.  Patient reports that he is in severe pain.  He called his PCP today and they placed a referral to a new pain clinic.  He is requesting to get back on his methadone.  He has not taken any Suboxone today.  Patient also reports that since he got out of any pain he has been having chest pain.  The pain is left-sided, does not radiate or move.  He denies any current nausea, vomiting, or diarrhea.  No shortness of breath, cough, or fever.  He denies any new or or changed leg swelling.  He tells me that when he was here previously he was given a perscription for oxycodone and two shots of dilaudid.     PDMP review:  06/06/2018  2   06/06/2018  Buprenorp-Nalox 8-2 MG SL Film  30.00 15 Kw Gya   [id number]   Nor ([id number])   0  16.00 mg  Comm Ins   [place]  06/05/2018  2   06/05/2018  Dextroamp-Amphet Er 30 MG Cap  90.00 30 Ge Plo   [id number]   Nor ([id number])   0   Medicare   [place]    05/26/2018  2   03/06/2018  Clonazepam 1 MG Tablet  90.00 30 Ge Plo   [id number]   Nor ([id number])   0  6.00 LME  Private Pay   [place]  05/04/2018  2   03/06/2018  Clonazepam 1 MG Tablet  90.00 30 Ge Plo   [id number]   Wal ([id number])   1  6.00 LME  Comm Ins   [place]  05/04/2018  2   03/06/2018  Dextroamp-Amphet Er 30 MG Cap  90.00 30 Ge Plo   [id number]   Thr ([id number])   0   Private Pay   [place]  04/21/2018  2   03/06/2018  Clonazepam 1 MG Tablet  90.00 30 Ge Plo   [id number]   Nor ([id number])   1  6.00 LME  Medicare   [place]  04/04/2018  2   03/06/2018  Dextroamp-Amphet Er 30 MG Cap  90.00 30 Ge Plo   [id number]   Thr (4878)   0   Private Pay   [place]  03/30/2018  2   03/06/2018  Clonazepam 1 MG Tablet  90.00 30 Ge Plo   [id number]   Wal ([id number])   0  6.00 LME  Comm Ins  [place]  03/12/2018  2   03/06/2018  Clonazepam 1 MG Tablet  90.00 30 Ge Plo   [id number]   Nor (2541)   0  6.00 LME  Private Pay   [place]  03/05/2018  2   12/26/2017  Dextroamp-Amphet Er 30 MG Cap  90.00 30 Ge Plo   [id number]   Wal (7587)   0   Comm Ins   [place]  02/23/2018  2   12/26/2017  Clonazepam 1 MG Tablet  90.00 30 Ge Plo   [id number]   Wal ([id number])   0  6.00 LME  Comm Ins   [place]  02/03/2018  2   12/26/2017  Dextroamp-Amphet Er 30 MG Cap  90.00 30 Ge Plo   [id number]   Gib (4800)   0   Medicare   [place]  02/03/2018  2   10/13/2017  Clonazepam 1 MG Tablet  90.00 30 Ge Plo   [id number]   Thr (4878)   2  6.00 LME  Private Pay   [place]  01/19/2018  2   08/08/2017  Clonazepam 1 MG Tablet  90.00 30 Ge Plo   [id number]   Wal ([id number])   1  6.00 LME  Comm Ins   [place]  01/12/2018  2   01/12/2018  Oxycodone-Acetaminophen 5-325  20.00 3 Un Pha   [id number]   Gib (4800)   0  50.00 MME  Private Pay   [place]  01/01/2018  2   12/26/2017  Dextroamp-Amphet Er 30 MG Cap  90.00 30 Ge Plo   [id number]   Wal (7587)   0   Comm Ins   [place]  12/27/2017  2   10/13/2017  Clonazepam 1 MG Tablet  90.00 30 Ge Plo   [id number]   Thr (4878)   1  6.00 LME  Private Pay   [place]  12/07/2017  2   08/08/2017  Clonazepam 1 MG Tablet  90.00 30 Ge Plo   [id number]   Wal (7587)   0   6.00 LME  Comm Ins   [place]  12/02/2017  2   10/13/2017  Dextroamp-Amphet Er 30 MG Cap  90.00 30 Ge Plo   [id number]   Wal ([id number])   0   Comm Ins   [place]  11/09/2017  2   10/13/2017  Clonazepam 1 MG Tablet  90.00 30 Ge Plo   [id number]   Thr (4878)   0  6.00 LME  Private Pay   [place]  11/03/2017  2   10/13/2017  Dextroamp-Amphet Er 30 MG Cap  90.00 30 Ge Plo   [id number]   Gib (4800)   0   Private Pay   [place]  10/12/2017  2   08/08/2017  Clonazepam 1 MG Tablet  90.00 30 Ge Plo   [id number]   Wal (7587)   2  6.00 LME  Comm Ins   [place]  10/06/2017  2   10/06/2017  Dextroamp-Amphet Er 30 MG Cap  90.00 30 Ke Red   [id number]   Wal (7587)   0   Comm Ins   [place]  09/15/2017  2   08/08/2017  Clonazepam 1 MG Tablet  90.00 30 Ge Plo   [id number]   Wal ([id number])   1  6.00 LME  Comm Ins   [place]  09/04/2017  2   08/08/2017  Dextroamp-Amphet Er 30 MG Cap  90.00 30 Ge Plo   [id number]   Wal ([id number])   0   Comm  Ins   [place]  08/13/2017  2   08/08/2017  Clonazepam 1 MG Tablet  90.00 30 Ge Plo   [id number]   Wal (7587)   0  6.00 LME  Comm Ins   [place]  08/08/2017  2   08/08/2017  Dextroamp-Amphet Er 30 MG Cap  90.00 30 Ge Plo   [id number]   Wal (7587)   0   Comm Ins   [place]  07/25/2017  2   06/09/2017  Clonazepam 1 MG Tablet  70.00 23 Ge Plo   [id number]   Wal (7587)   2  6.09 LME  Comm Ins   [place]  07/11/2017  1   06/09/2017  Dextroamp-Amphet Er 30 MG Cap  90.00 30 Ge Plo   [id number]   Wal (7587)   0   Comm Ins   [place]  07/02/2017  1   06/09/2017  Clonazepam 1 MG Tablet  70.00 23 Ge Plo   [id number]   Wal (7587)   1  6.09 LME  Comm Ins   [place]  06/13/2017  2   05/20/2017  Dextroamp-Amphetamin 30 MG Tab  90.00 30 Ar Pol   [id number]   Har (9677)   0   Comm Ins   [place]  06/09/2017  1   06/09/2017  Clonazepam 1 MG Tablet  70.00 23 Ge Plo   [id number]   Wal (7587)   0  6.09 LME  Comm Ins   [place]  06/09/2017  1   06/09/2017  Dextroamp-Amphet Er 30 MG Cap  90.00 30 Ge Plo   [id number]   Wal ([id number])   0   Comm Ins          HPI  Past Medical History:  Diagnosis Date   Arthritis    Chronic back pain     Patient  Active Problem List   Diagnosis Date Noted   Narcotic withdrawal ([place]) 06/09/2018   Hypertensive urgency 06/08/2018   Opioid withdrawal ([place]) 06/08/2018   Lumbar radiculopathy, chronic 06/08/2018   Anemia 06/08/2018   Abnormal liver function 06/08/2018    Past Surgical History:  Procedure Laterality Date   KNEE ARTHROSCOPY W/ DEBRIDEMENT          Home Medications    Prior to Admission medications   Medication Sig Start Date End Date Taking? Authorizing Provider  amphetamine-dextroamphetamine (ADDERALL XR) 30 MG 24 hr capsule Take 30 mg by mouth 3 (three) times daily. 01/01/18   [provider]  buprenorphine-naloxone (SUBOXONE) 8-2 mg SUBL SL tablet Place 1 tablet under the tongue daily.    [provider]  clonazePAM (KLONOPIN) 1 MG tablet Take 1 mg by mouth 2 (two) times daily. 12/07/17   [provider]  gabapentin (NEURONTIN) 300 MG capsule Take 1 capsule (300 mg total) by mouth 3 (three) times daily for 30 days. 06/11/18 07/11/18  Manuella Ghazi, Pratik D, DO  hydrochlorothiazide (HYDRODIURIL) 12.5 MG tablet Take 1 tablet (12.5 mg total) by mouth daily. 01/10/18   Hedges, Dellis Filbert, PA-C  lisinopril (ZESTRIL) 10 MG tablet Take 10 mg by mouth daily. 05/08/18   [provider]  methocarbamol (ROBAXIN) 500 MG tablet Take 1 tablet (500 mg total) by mouth every 8 (eight) hours as needed for up to 10 days for muscle spasms. 06/11/18 06/21/18  Manuella Ghazi, Pratik D, DO  ondansetron (ZOFRAN ODT) 4 MG disintegrating tablet Take 1 tablet (4 mg total) by mouth every 6 (six) hours as needed. 01/12/18   Ward, Delice Bison, DO  PARoxetine (PAXIL) 30 MG tablet Take 30 mg by mouth 2 (two) times daily. 10/25/17   [provider]  predniSONE (DELTASONE) 20 MG tablet Two daily with food 06/13/18   Robyn Haber, MD    Family History Family History  Problem Relation Age of Onset   Obesity Other     Social History Social History   Tobacco Use   Smoking status:  Never Smoker   Smokeless tobacco: Never Used  Substance Use Topics   Alcohol use: Never    Frequency: Never   Drug use: Not Currently     Allergies   Patient has no known allergies.   Review of Systems Review of Systems  Constitutional: Negative for chills and fever.  Respiratory: Negative for cough, chest tightness and shortness of breath.   Cardiovascular: Positive for chest pain. Negative for palpitations and leg swelling.  Gastrointestinal: Negative for abdominal pain, constipation, diarrhea, nausea and vomiting.  Genitourinary: Negative for decreased urine volume, difficulty urinating and enuresis.  Musculoskeletal: Positive for back pain. Negative for neck pain and neck stiffness.  Neurological: Negative for weakness, numbness and headaches.  All other systems reviewed and are negative.    Physical Exam Updated Vital Signs BP (!) 162/86 (BP Location: Right Arm)    Pulse 92    Temp (!) 97.4 F (36.3 C) (Oral)    Resp 18    SpO2 100%   Physical Exam Vitals signs and nursing note reviewed.  Constitutional:      General: He is not in acute distress.    Appearance: He is well-developed. He is not diaphoretic.  HENT:     Head: Normocephalic and atraumatic.  Eyes:     General: No scleral icterus.       Right eye: No discharge.        Left eye: No discharge.     Conjunctiva/sclera: Conjunctivae normal.  Neck:     Musculoskeletal: Normal range of motion and neck supple.  Cardiovascular:     Rate and Rhythm: Normal rate and regular rhythm.     Pulses: Normal pulses.     Heart sounds: Normal heart sounds.  Pulmonary:     Effort: Pulmonary effort is normal. No respiratory distress.     Breath sounds: No stridor.  Abdominal:     General: Bowel sounds are normal. There is no distension.     Palpations: Abdomen is soft.     Tenderness: There is no abdominal tenderness.     Comments: While palpating abdomen patient states "No it doesn't hurt see" and strikes his  epigastric area multiple times.  He stopped when I asked him to stop.   Musculoskeletal:        General: No deformity.     Comments: Patient declined inspection of lower back.   Skin:    General: Skin is warm and dry.  Neurological:     Mental Status: He is alert.     Sensory: No sensory deficit (Sensation intact to bilateral lower legs to light touch).     Motor: No abnormal muscle tone.  Psychiatric:     Comments: While palpating abdomen patient strikes his epigastric area multiple times saying "it doesn't hurt."  He stopped when asked to.       ED Treatments / Results  Labs (all labs ordered are listed, but only abnormal results are displayed) Labs Reviewed  COMPREHENSIVE METABOLIC PANEL - Abnormal; Notable for the following components:      Result Value   Glucose,  Bld 112 (*)    Alkaline Phosphatase 132 (*)    Total Bilirubin 0.2 (*)    All other components within normal limits  CBC WITH DIFFERENTIAL/PLATELET - Abnormal; Notable for the following components:   WBC 12.6 (*)    Hemoglobin 12.6 (*)    Abs Immature Granulocytes 0.10 (*)    All other components within normal limits  TROPONIN I    EKG EKG Interpretation  Date/Time:  Thursday Jun 15 2018 12:19:57 EDT Ventricular Rate:  77 PR Interval:    QRS Duration: 109 QT Interval:  392 QTC Calculation: 444 R Axis:   47 Text Interpretation:  Sinus rhythm Confirmed by Quintella Reichert [phone]) on 06/15/2018 12:23:26 PM   Radiology  Dg Chest Port 1 View  Result Date: 06/15/2018 CLINICAL DATA:  Chest pain. EXAM: PORTABLE CHEST 1 VIEW COMPARISON:  Chest x-ray dated March 03, 2006. FINDINGS: The heart size and mediastinal contours are within normal limits. Atherosclerotic calcification of the aortic arch. Normal pulmonary vascularity. IMPRESSION: No active disease. Electronically Signed   By: Titus Dubin M.D.   On: 06/15/2018 12:20    Procedures Procedures (including critical care time)  Medications Ordered in  ED Medications  ketorolac (TORADOL) 30 MG/ML injection 15 mg (15 mg Intravenous Not Given 06/15/18 1318)     Initial Impression / Assessment and Plan / ED Course  I have reviewed the triage vital signs and the nursing notes.  Pertinent labs & imaging results that were available during my care of the patient were reviewed by me and considered in my medical decision making (see chart for details).  Clinical Course as of Jun 14 1348  Thu Jun 15, 2018  1341 Patient refused toradol.    [EH]    Clinical Course User Index [EH] Lorin Glass, PA-C      Patient presents today requesting management of his chronic back pain.  He recently stopped taking the methadone that he was on and was switched to Suboxone.  His PCP has placed a consult for new pain management.  He was recently admitted to the ICU at [facility] for acute narcotic withdrawal where he was noted to be anemic with abnormal liver enzymes.  His back pain is not changed in any way other than increased severity.  Therefore do not suspect dissection, cauda equina, or other emergent pathology.  he denies any new changes to bowel or bladder function, no new numbness or tingling, or saddle anesthesia.  Denies any recent fevers or trauma.    I attempted to explain to patient that typically we do not discharge patients with prescriptions for narcotics for chronic pain.  He has a primary care doctor who has already placed a pain management order.  PDMP review shows that he should still have suboxone left.    I have reviewed records of multiple ED visits with similar or other pain related complaints, usually with negative workups. I feel that the patient's pain is chronic and cannot be appropriately or safely treated in an emergency department setting.   I do not feel that providing narcotic pain medication is in this patient's best interest. I have urged the patient to have close follow up with their provider or pain specialist and have  provided the adequate resources for this. I have explicitly discussed with the patient return precautions and have reassured patient that they can always be seen and evaluated in the emergency department for any condition that they feel is emergent, and that they will  be given treatment as the EDP feels is appropriate and safe, but this may not involve the use of narcotic pain medications. The patient was given the opportunity to voice any further questions or concerns and these were addressed to the best of my ability.    Patient reported chest pain since discharge from [facility].  Labs are obtained and reviewed, he does not have any significant anemia.  Based on the constant nature of his pain since discharge with normal troponin do not suspect ACS.  EKG obtained without evidence of ischemia.  He states that his chest pain is not why he is here today, he is here for his back pain and does not wish for additional evaluation at this time.  He made this decision after being informed of possible risks and states his understanding.  Chest x-ray without evidence of acute abnormalities.  This patient was seen as a shared visit with Dr. Ralene Bathe.  Patient was offered Toradol while in the emergency room which he declined.    Patient was given resources for chronic pain follow-up, and his chart review shows that he has already had an order placed for pain management.    Patient's blood pressure was slightly elevated while in the emergency room.  This is a known issue.  Recommended outpatient follow up.   Final Clinical Impressions(s) / ED Diagnoses   Final diagnoses:  Chronic midline low back pain, unspecified whether sciatica present    ED Discharge Orders    None       Ollen Gross 06/15/18 1354    Quintella Reichert, MD 06/16/18 1054

## 2018-06-15 NOTE — Discharge Instructions (Addendum)
Please follow up with your doctor.    While in the ED your blood pressure was high.  Please follow up with your primary care doctor or the wellness clinic for repeat evaluation as you may need medication.  High blood pressure can cause long term, potentially serious, damage if left untreated.

## 2018-06-15 NOTE — ED Triage Notes (Signed)
Pt went to pain clinic for chronic back pain on the 7th, stopped taking methadone and began taking suboxone, went to UC on the 12th for pain medicine and was given a shot of toradol. States the pain is back with no relief.

## 2018-06-19 DIAGNOSIS — G8929 Other chronic pain: Secondary | ICD-10-CM | POA: Diagnosis not present

## 2018-06-19 DIAGNOSIS — M545 Low back pain: Secondary | ICD-10-CM | POA: Diagnosis not present

## 2018-06-19 DIAGNOSIS — I10 Essential (primary) hypertension: Secondary | ICD-10-CM | POA: Diagnosis not present

## 2018-06-20 DIAGNOSIS — Z79891 Long term (current) use of opiate analgesic: Secondary | ICD-10-CM | POA: Diagnosis not present

## 2018-06-20 DIAGNOSIS — F1120 Opioid dependence, uncomplicated: Secondary | ICD-10-CM | POA: Diagnosis not present

## 2018-06-20 DIAGNOSIS — M4306 Spondylolysis, lumbar region: Secondary | ICD-10-CM | POA: Diagnosis not present

## 2018-06-20 DIAGNOSIS — G894 Chronic pain syndrome: Secondary | ICD-10-CM | POA: Diagnosis not present

## 2018-06-20 DIAGNOSIS — M4856XG Collapsed vertebra, not elsewhere classified, lumbar region, subsequent encounter for fracture with delayed healing: Secondary | ICD-10-CM | POA: Diagnosis not present

## 2018-06-20 DIAGNOSIS — G8929 Other chronic pain: Secondary | ICD-10-CM | POA: Diagnosis not present

## 2018-06-20 DIAGNOSIS — M545 Low back pain: Secondary | ICD-10-CM | POA: Diagnosis not present

## 2018-07-18 DIAGNOSIS — F1120 Opioid dependence, uncomplicated: Secondary | ICD-10-CM | POA: Diagnosis not present

## 2018-07-18 DIAGNOSIS — G894 Chronic pain syndrome: Secondary | ICD-10-CM | POA: Diagnosis not present

## 2018-07-18 DIAGNOSIS — M4306 Spondylolysis, lumbar region: Secondary | ICD-10-CM | POA: Diagnosis not present

## 2018-07-18 DIAGNOSIS — Z79891 Long term (current) use of opiate analgesic: Secondary | ICD-10-CM | POA: Diagnosis not present

## 2018-07-18 DIAGNOSIS — M545 Low back pain: Secondary | ICD-10-CM | POA: Diagnosis not present

## 2018-07-18 DIAGNOSIS — G8929 Other chronic pain: Secondary | ICD-10-CM | POA: Diagnosis not present

## 2018-07-18 DIAGNOSIS — M4856XG Collapsed vertebra, not elsewhere classified, lumbar region, subsequent encounter for fracture with delayed healing: Secondary | ICD-10-CM | POA: Diagnosis not present

## 2018-08-01 DIAGNOSIS — M5416 Radiculopathy, lumbar region: Secondary | ICD-10-CM | POA: Diagnosis not present

## 2018-08-01 DIAGNOSIS — M48062 Spinal stenosis, lumbar region with neurogenic claudication: Secondary | ICD-10-CM | POA: Diagnosis not present

## 2018-08-01 DIAGNOSIS — M5136 Other intervertebral disc degeneration, lumbar region: Secondary | ICD-10-CM | POA: Diagnosis not present

## 2018-08-01 DIAGNOSIS — M47816 Spondylosis without myelopathy or radiculopathy, lumbar region: Secondary | ICD-10-CM | POA: Diagnosis not present

## 2018-08-01 DIAGNOSIS — G894 Chronic pain syndrome: Secondary | ICD-10-CM | POA: Diagnosis not present

## 2018-08-15 DIAGNOSIS — F1120 Opioid dependence, uncomplicated: Secondary | ICD-10-CM | POA: Diagnosis not present

## 2018-08-15 DIAGNOSIS — Z79891 Long term (current) use of opiate analgesic: Secondary | ICD-10-CM | POA: Diagnosis not present

## 2018-08-15 DIAGNOSIS — G894 Chronic pain syndrome: Secondary | ICD-10-CM | POA: Diagnosis not present

## 2018-08-15 DIAGNOSIS — M4856XG Collapsed vertebra, not elsewhere classified, lumbar region, subsequent encounter for fracture with delayed healing: Secondary | ICD-10-CM | POA: Diagnosis not present

## 2018-08-15 DIAGNOSIS — M4306 Spondylolysis, lumbar region: Secondary | ICD-10-CM | POA: Diagnosis not present

## 2018-08-15 DIAGNOSIS — M545 Low back pain: Secondary | ICD-10-CM | POA: Diagnosis not present

## 2018-08-15 DIAGNOSIS — G8929 Other chronic pain: Secondary | ICD-10-CM | POA: Diagnosis not present

## 2018-09-01 DIAGNOSIS — R69 Illness, unspecified: Secondary | ICD-10-CM | POA: Diagnosis not present

## 2018-09-04 DIAGNOSIS — R69 Illness, unspecified: Secondary | ICD-10-CM | POA: Diagnosis not present

## 2018-09-18 DIAGNOSIS — R69 Illness, unspecified: Secondary | ICD-10-CM | POA: Diagnosis not present

## 2018-12-04 ENCOUNTER — Emergency Department (HOSPITAL_COMMUNITY): Payer: Medicare HMO

## 2018-12-04 ENCOUNTER — Encounter (HOSPITAL_COMMUNITY): Payer: Self-pay

## 2018-12-04 ENCOUNTER — Inpatient Hospital Stay (HOSPITAL_COMMUNITY)
Admission: EM | Admit: 2018-12-04 | Discharge: 2018-12-07 | DRG: 494 | Disposition: A | Discharge status: Home Health | Payer: Medicare HMO | Attending: Orthopaedic Surgery | Admitting: Orthopaedic Surgery

## 2018-12-04 ENCOUNTER — Inpatient Hospital Stay (HOSPITAL_COMMUNITY): Payer: Medicare HMO | Admitting: Anesthesiology

## 2018-12-04 ENCOUNTER — Inpatient Hospital Stay (HOSPITAL_COMMUNITY): Payer: Medicare HMO

## 2018-12-04 ENCOUNTER — Other Ambulatory Visit: Payer: Self-pay

## 2018-12-04 ENCOUNTER — Encounter (HOSPITAL_COMMUNITY)
Admission: EM | Disposition: A | Discharge status: Home Health | Payer: Self-pay | Source: Home / Self Care | Attending: Orthopaedic Surgery

## 2018-12-04 DIAGNOSIS — Z7952 Long term (current) use of systemic steroids: Secondary | ICD-10-CM

## 2018-12-04 DIAGNOSIS — F329 Major depressive disorder, single episode, unspecified: Secondary | ICD-10-CM | POA: Diagnosis present

## 2018-12-04 DIAGNOSIS — Z7982 Long term (current) use of aspirin: Secondary | ICD-10-CM | POA: Diagnosis not present

## 2018-12-04 DIAGNOSIS — I160 Hypertensive urgency: Secondary | ICD-10-CM | POA: Diagnosis not present

## 2018-12-04 DIAGNOSIS — M79605 Pain in left leg: Secondary | ICD-10-CM

## 2018-12-04 DIAGNOSIS — S82301S Unspecified fracture of lower end of right tibia, sequela: Secondary | ICD-10-CM | POA: Diagnosis not present

## 2018-12-04 DIAGNOSIS — D62 Acute posthemorrhagic anemia: Secondary | ICD-10-CM | POA: Diagnosis not present

## 2018-12-04 DIAGNOSIS — M549 Dorsalgia, unspecified: Secondary | ICD-10-CM | POA: Diagnosis not present

## 2018-12-04 DIAGNOSIS — S82402A Unspecified fracture of shaft of left fibula, initial encounter for closed fracture: Secondary | ICD-10-CM | POA: Diagnosis present

## 2018-12-04 DIAGNOSIS — Z79891 Long term (current) use of opiate analgesic: Secondary | ICD-10-CM

## 2018-12-04 DIAGNOSIS — R0689 Other abnormalities of breathing: Secondary | ICD-10-CM | POA: Diagnosis not present

## 2018-12-04 DIAGNOSIS — T1490XA Injury, unspecified, initial encounter: Secondary | ICD-10-CM

## 2018-12-04 DIAGNOSIS — Z20828 Contact with and (suspected) exposure to other viral communicable diseases: Secondary | ICD-10-CM | POA: Diagnosis not present

## 2018-12-04 DIAGNOSIS — M1990 Unspecified osteoarthritis, unspecified site: Secondary | ICD-10-CM | POA: Diagnosis not present

## 2018-12-04 DIAGNOSIS — S82392A Other fracture of lower end of left tibia, initial encounter for closed fracture: Secondary | ICD-10-CM | POA: Diagnosis not present

## 2018-12-04 DIAGNOSIS — R69 Illness, unspecified: Secondary | ICD-10-CM | POA: Diagnosis not present

## 2018-12-04 DIAGNOSIS — I878 Other specified disorders of veins: Secondary | ICD-10-CM | POA: Diagnosis present

## 2018-12-04 DIAGNOSIS — Z79899 Other long term (current) drug therapy: Secondary | ICD-10-CM | POA: Diagnosis not present

## 2018-12-04 DIAGNOSIS — S82301A Unspecified fracture of lower end of right tibia, initial encounter for closed fracture: Secondary | ICD-10-CM | POA: Diagnosis not present

## 2018-12-04 DIAGNOSIS — G8929 Other chronic pain: Secondary | ICD-10-CM | POA: Diagnosis present

## 2018-12-04 DIAGNOSIS — R0902 Hypoxemia: Secondary | ICD-10-CM | POA: Diagnosis not present

## 2018-12-04 DIAGNOSIS — Z03818 Encounter for observation for suspected exposure to other biological agents ruled out: Secondary | ICD-10-CM | POA: Diagnosis not present

## 2018-12-04 DIAGNOSIS — S82302D Unspecified fracture of lower end of left tibia, subsequent encounter for closed fracture with routine healing: Secondary | ICD-10-CM | POA: Diagnosis not present

## 2018-12-04 DIAGNOSIS — S82202A Unspecified fracture of shaft of left tibia, initial encounter for closed fracture: Secondary | ICD-10-CM | POA: Diagnosis not present

## 2018-12-04 DIAGNOSIS — I10 Essential (primary) hypertension: Secondary | ICD-10-CM | POA: Diagnosis not present

## 2018-12-04 DIAGNOSIS — F419 Anxiety disorder, unspecified: Secondary | ICD-10-CM | POA: Diagnosis present

## 2018-12-04 DIAGNOSIS — T148XXA Other injury of unspecified body region, initial encounter: Secondary | ICD-10-CM

## 2018-12-04 DIAGNOSIS — R52 Pain, unspecified: Secondary | ICD-10-CM | POA: Diagnosis not present

## 2018-12-04 HISTORY — PX: TIBIA IM NAIL INSERTION: SHX2516

## 2018-12-04 LAB — COMPREHENSIVE METABOLIC PANEL
ALT: 22 U/L (ref 0–44)
AST: 26 U/L (ref 15–41)
Albumin: 3.8 g/dL (ref 3.5–5.0)
Alkaline Phosphatase: 117 U/L (ref 38–126)
Anion gap: 7 (ref 5–15)
BUN: 19 mg/dL (ref 6–20)
CO2: 26 mmol/L (ref 22–32)
Calcium: 8.4 mg/dL — ABNORMAL LOW (ref 8.9–10.3)
Chloride: 102 mmol/L (ref 98–111)
Creatinine, Ser: 1.19 mg/dL (ref 0.61–1.24)
GFR calc Af Amer: >60 mL/min (ref >60)
GFR calc non Af Amer: >60 mL/min (ref >60)
Glucose, Bld: 137 mg/dL — ABNORMAL HIGH (ref 70–99)
Potassium: 3.7 mmol/L (ref 3.5–5.1)
Sodium: 135 mmol/L (ref 135–145)
Total Bilirubin: 0.6 mg/dL (ref 0.3–1.2)
Total Protein: 6.9 g/dL (ref 6.5–8.1)

## 2018-12-04 LAB — CBC WITH DIFFERENTIAL/PLATELET
Abs Immature Granulocytes: 0.04 10*3/uL (ref 0.00–0.07)
Basophils Absolute: 0 10*3/uL (ref 0.0–0.1)
Basophils Relative: 0 %
Eosinophils Absolute: 0 10*3/uL (ref 0.0–0.5)
Eosinophils Relative: 0 %
HCT: 38 % — ABNORMAL LOW (ref 39.0–52.0)
Hemoglobin: 11.6 g/dL — ABNORMAL LOW (ref 13.0–17.0)
Immature Granulocytes: 0 %
Lymphocytes Relative: 18 %
Lymphs Abs: 2.2 10*3/uL (ref 0.7–4.0)
MCH: 27.7 pg (ref 26.0–34.0)
MCHC: 30.5 g/dL (ref 30.0–36.0)
MCV: 90.7 fL (ref 80.0–100.0)
Monocytes Absolute: 0.9 10*3/uL (ref 0.1–1.0)
Monocytes Relative: 7 %
Neutro Abs: 9.1 10*3/uL — ABNORMAL HIGH (ref 1.7–7.7)
Neutrophils Relative %: 75 %
Platelets: 318 10*3/uL (ref 150–400)
RBC: 4.19 MIL/uL — ABNORMAL LOW (ref 4.22–5.81)
RDW: 13.9 % (ref 11.5–15.5)
WBC: 12.2 10*3/uL — ABNORMAL HIGH (ref 4.0–10.5)
nRBC: 0 % (ref 0.0–0.2)

## 2018-12-04 LAB — PROTIME-INR
INR: 1.1 (ref 0.8–1.2)
Prothrombin Time: 14.2 seconds (ref 11.4–15.2)

## 2018-12-04 LAB — SARS CORONAVIRUS 2 BY RT PCR (HOSPITAL ORDER, PERFORMED IN ~~LOC~~ HOSPITAL LAB): SARS Coronavirus 2: NEGATIVE

## 2018-12-04 SURGERY — INSERTION, INTRAMEDULLARY ROD, TIBIA
Anesthesia: General | Site: Leg Lower | Laterality: Left

## 2018-12-04 MED ORDER — CEFAZOLIN SODIUM-DEXTROSE 2-4 GM/100ML-% IV SOLN
2.0000 g | Freq: Four times a day (QID) | INTRAVENOUS | Status: AC
  Administered 2018-12-04 – 2018-12-05 (×3): 2 g via INTRAVENOUS
  Filled 2018-12-04 (×3): qty 100

## 2018-12-04 MED ORDER — MIDAZOLAM HCL 5 MG/5ML IJ SOLN
INTRAMUSCULAR | Status: DC | PRN
  Administered 2018-12-04: 2 mg via INTRAVENOUS

## 2018-12-04 MED ORDER — PHENYLEPHRINE 40 MCG/ML (10ML) SYRINGE FOR IV PUSH (FOR BLOOD PRESSURE SUPPORT)
PREFILLED_SYRINGE | INTRAVENOUS | Status: AC
  Filled 2018-12-04: qty 10

## 2018-12-04 MED ORDER — FENTANYL CITRATE (PF) 100 MCG/2ML IJ SOLN
100.0000 ug | Freq: Once | INTRAMUSCULAR | Status: AC
  Administered 2018-12-04: 100 ug via INTRAVENOUS
  Filled 2018-12-04: qty 2

## 2018-12-04 MED ORDER — OXYCODONE HCL 5 MG PO TABS
10.0000 mg | ORAL_TABLET | ORAL | Status: DC | PRN
  Administered 2018-12-06: 10 mg via ORAL
  Filled 2018-12-04: qty 2

## 2018-12-04 MED ORDER — METHOCARBAMOL 1000 MG/10ML IJ SOLN
500.0000 mg | Freq: Four times a day (QID) | INTRAVENOUS | Status: DC | PRN
  Filled 2018-12-04: qty 5

## 2018-12-04 MED ORDER — PROMETHAZINE HCL 25 MG/ML IJ SOLN: 6.2500 mg | INTRAMUSCULAR | Status: DC | PRN

## 2018-12-04 MED ORDER — KETOROLAC TROMETHAMINE 30 MG/ML IJ SOLN
30.0000 mg | Freq: Once | INTRAMUSCULAR | Status: AC | PRN
  Administered 2018-12-04: 16:00:00 30 mg via INTRAVENOUS

## 2018-12-04 MED ORDER — OXYCODONE HCL 5 MG PO TABS
5.0000 mg | ORAL_TABLET | ORAL | Status: DC | PRN
  Administered 2018-12-04: 10 mg via ORAL

## 2018-12-04 MED ORDER — DIPHENHYDRAMINE HCL 12.5 MG/5ML PO ELIX: 12.5000 mg | ORAL_SOLUTION | ORAL | Status: DC | PRN

## 2018-12-04 MED ORDER — OXYCODONE HCL 5 MG PO TABS
10.0000 mg | ORAL_TABLET | ORAL | Status: DC | PRN
  Administered 2018-12-04: 15 mg via ORAL
  Filled 2018-12-04 (×2): qty 3

## 2018-12-04 MED ORDER — SODIUM CHLORIDE 0.9 % IV SOLN
INTRAVENOUS | Status: DC
  Administered 2018-12-04: 03:00:00 via INTRAVENOUS

## 2018-12-04 MED ORDER — HYDROMORPHONE HCL 1 MG/ML IJ SOLN
INTRAMUSCULAR | Status: AC
  Filled 2018-12-04: qty 1

## 2018-12-04 MED ORDER — ACETAMINOPHEN 500 MG PO TABS: 1000.0000 mg | ORAL_TABLET | Freq: Four times a day (QID) | ORAL | Status: DC

## 2018-12-04 MED ORDER — HYDROMORPHONE HCL 1 MG/ML IJ SOLN
0.5000 mg | INTRAMUSCULAR | Status: DC | PRN
  Administered 2018-12-04: 1 mg via INTRAVENOUS
  Filled 2018-12-04: qty 1

## 2018-12-04 MED ORDER — METHOCARBAMOL 500 MG PO TABS
ORAL_TABLET | ORAL | Status: AC
  Filled 2018-12-04: qty 1

## 2018-12-04 MED ORDER — METOCLOPRAMIDE HCL 5 MG PO TABS: 5.0000 mg | ORAL_TABLET | Freq: Three times a day (TID) | ORAL | Status: DC | PRN

## 2018-12-04 MED ORDER — DEXAMETHASONE SODIUM PHOSPHATE 10 MG/ML IJ SOLN
INTRAMUSCULAR | Status: AC
  Filled 2018-12-04: qty 1

## 2018-12-04 MED ORDER — METOCLOPRAMIDE HCL 5 MG/ML IJ SOLN: 5.0000 mg | Freq: Three times a day (TID) | INTRAMUSCULAR | Status: DC | PRN

## 2018-12-04 MED ORDER — SUGAMMADEX SODIUM 200 MG/2ML IV SOLN
INTRAVENOUS | Status: DC | PRN
  Administered 2018-12-04: 200 mg via INTRAVENOUS

## 2018-12-04 MED ORDER — ONDANSETRON HCL 4 MG PO TABS: 4.0000 mg | ORAL_TABLET | Freq: Four times a day (QID) | ORAL | Status: DC | PRN

## 2018-12-04 MED ORDER — HYDROMORPHONE HCL 1 MG/ML IJ SOLN
1.0000 mg | Freq: Once | INTRAMUSCULAR | Status: AC
  Administered 2018-12-04: 1 mg via INTRAVENOUS
  Filled 2018-12-04: qty 1

## 2018-12-04 MED ORDER — ROCURONIUM BROMIDE 10 MG/ML (PF) SYRINGE
PREFILLED_SYRINGE | INTRAVENOUS | Status: DC | PRN
  Administered 2018-12-04: 60 mg via INTRAVENOUS

## 2018-12-04 MED ORDER — 0.9 % SODIUM CHLORIDE (POUR BTL) OPTIME
TOPICAL | Status: DC | PRN
  Administered 2018-12-04: 1000 mL

## 2018-12-04 MED ORDER — CEFAZOLIN SODIUM-DEXTROSE 2-3 GM-%(50ML) IV SOLR
INTRAVENOUS | Status: DC | PRN
  Administered 2018-12-04: 2 g via INTRAVENOUS

## 2018-12-04 MED ORDER — ASPIRIN EC 81 MG PO TBEC
81.0000 mg | DELAYED_RELEASE_TABLET | Freq: Two times a day (BID) | ORAL | Status: DC
  Administered 2018-12-04 – 2018-12-07 (×6): 81 mg via ORAL
  Filled 2018-12-04 (×6): qty 1

## 2018-12-04 MED ORDER — DEXMEDETOMIDINE HCL 200 MCG/2ML IV SOLN
INTRAVENOUS | Status: DC | PRN
  Administered 2018-12-04: 8 ug via INTRAVENOUS
  Administered 2018-12-04: 12 ug via INTRAVENOUS
  Administered 2018-12-04: 8 ug via INTRAVENOUS

## 2018-12-04 MED ORDER — SORBITOL 70 % SOLN: 30.0000 mL | Freq: Every day | Status: DC | PRN

## 2018-12-04 MED ORDER — LIDOCAINE 2% (20 MG/ML) 5 ML SYRINGE
INTRAMUSCULAR | Status: DC | PRN
  Administered 2018-12-04: 100 mg via INTRAVENOUS

## 2018-12-04 MED ORDER — MAGNESIUM CITRATE PO SOLN: 1.0000 | Freq: Once | ORAL | Status: DC | PRN

## 2018-12-04 MED ORDER — OXYCODONE HCL 5 MG PO TABS
ORAL_TABLET | ORAL | Status: AC
  Filled 2018-12-04: qty 2

## 2018-12-04 MED ORDER — HYDROMORPHONE HCL 1 MG/ML IJ SOLN: 1.0000 mg | Freq: Once | INTRAMUSCULAR | Status: DC

## 2018-12-04 MED ORDER — METHOCARBAMOL 500 MG PO TABS
500.0000 mg | ORAL_TABLET | Freq: Four times a day (QID) | ORAL | Status: DC | PRN
  Administered 2018-12-04 – 2018-12-07 (×3): 500 mg via ORAL
  Filled 2018-12-04 (×2): qty 1

## 2018-12-04 MED ORDER — FENTANYL CITRATE (PF) 250 MCG/5ML IJ SOLN
INTRAMUSCULAR | Status: AC
  Filled 2018-12-04: qty 5

## 2018-12-04 MED ORDER — LISINOPRIL 10 MG PO TABS: 10.0000 mg | ORAL_TABLET | Freq: Every day | ORAL | Status: DC

## 2018-12-04 MED ORDER — MIDAZOLAM HCL 2 MG/2ML IJ SOLN
2.0000 mg | Freq: Once | INTRAMUSCULAR | Status: AC
  Administered 2018-12-04: 2 mg via INTRAVENOUS

## 2018-12-04 MED ORDER — AMPHETAMINE-DEXTROAMPHETAMINE 10 MG PO TABS
30.0000 mg | ORAL_TABLET | Freq: Three times a day (TID) | ORAL | Status: DC
  Administered 2018-12-05 – 2018-12-07 (×7): 30 mg via ORAL
  Filled 2018-12-04 (×7): qty 3

## 2018-12-04 MED ORDER — DEXAMETHASONE SODIUM PHOSPHATE 10 MG/ML IJ SOLN
INTRAMUSCULAR | Status: DC | PRN
  Administered 2018-12-04: 10 mg via INTRAVENOUS

## 2018-12-04 MED ORDER — METHOCARBAMOL 1000 MG/10ML IJ SOLN: 500.0000 mg | Freq: Four times a day (QID) | INTRAVENOUS | Status: DC | PRN

## 2018-12-04 MED ORDER — PAROXETINE HCL 30 MG PO TABS
60.0000 mg | ORAL_TABLET | Freq: Every day | ORAL | Status: DC
  Administered 2018-12-04 – 2018-12-06 (×3): 60 mg via ORAL
  Filled 2018-12-04 (×4): qty 2

## 2018-12-04 MED ORDER — METOCLOPRAMIDE HCL 10 MG PO TABS: 5.0000 mg | ORAL_TABLET | Freq: Three times a day (TID) | ORAL | Status: DC | PRN

## 2018-12-04 MED ORDER — OXYCODONE HCL 5 MG PO TABS
5.0000 mg | ORAL_TABLET | ORAL | Status: DC | PRN
  Administered 2018-12-07: 10 mg via ORAL
  Filled 2018-12-04: qty 2

## 2018-12-04 MED ORDER — ONDANSETRON HCL 4 MG/2ML IJ SOLN
INTRAMUSCULAR | Status: AC
  Filled 2018-12-04: qty 2

## 2018-12-04 MED ORDER — OXYCODONE HCL ER 10 MG PO T12A
10.0000 mg | EXTENDED_RELEASE_TABLET | Freq: Two times a day (BID) | ORAL | Status: DC
  Administered 2018-12-04 – 2018-12-07 (×6): 10 mg via ORAL
  Filled 2018-12-04 (×7): qty 1

## 2018-12-04 MED ORDER — ONDANSETRON HCL 4 MG/2ML IJ SOLN: 4.0000 mg | Freq: Four times a day (QID) | INTRAMUSCULAR | Status: DC | PRN

## 2018-12-04 MED ORDER — SODIUM CHLORIDE 0.9 % IV SOLN
INTRAVENOUS | Status: DC
  Administered 2018-12-05: 13:00:00 via INTRAVENOUS

## 2018-12-04 MED ORDER — FENTANYL CITRATE (PF) 250 MCG/5ML IJ SOLN
INTRAMUSCULAR | Status: DC | PRN
  Administered 2018-12-04: 50 ug via INTRAVENOUS
  Administered 2018-12-04 (×2): 100 ug via INTRAVENOUS

## 2018-12-04 MED ORDER — LIDOCAINE 2% (20 MG/ML) 5 ML SYRINGE
INTRAMUSCULAR | Status: AC
  Filled 2018-12-04: qty 5

## 2018-12-04 MED ORDER — DOCUSATE SODIUM 100 MG PO CAPS
100.0000 mg | ORAL_CAPSULE | Freq: Two times a day (BID) | ORAL | Status: DC
  Administered 2018-12-04 – 2018-12-07 (×6): 100 mg via ORAL
  Filled 2018-12-04 (×6): qty 1

## 2018-12-04 MED ORDER — MEPERIDINE HCL 25 MG/ML IJ SOLN: 6.2500 mg | INTRAMUSCULAR | Status: DC | PRN

## 2018-12-04 MED ORDER — HYDROMORPHONE HCL 1 MG/ML IJ SOLN
0.2500 mg | INTRAMUSCULAR | Status: DC | PRN
  Administered 2018-12-04 (×4): 0.5 mg via INTRAVENOUS

## 2018-12-04 MED ORDER — ACETAMINOPHEN 325 MG PO TABS: 325.0000 mg | ORAL_TABLET | Freq: Four times a day (QID) | ORAL | Status: DC | PRN

## 2018-12-04 MED ORDER — MIDAZOLAM HCL 2 MG/2ML IJ SOLN
INTRAMUSCULAR | Status: AC
  Filled 2018-12-04: qty 2

## 2018-12-04 MED ORDER — KETOROLAC TROMETHAMINE 30 MG/ML IJ SOLN
INTRAMUSCULAR | Status: AC
  Filled 2018-12-04: qty 1

## 2018-12-04 MED ORDER — PROPOFOL 10 MG/ML IV BOLUS
INTRAVENOUS | Status: DC | PRN
  Administered 2018-12-04: 200 mg via INTRAVENOUS

## 2018-12-04 MED ORDER — HYDROMORPHONE HCL 1 MG/ML IJ SOLN: 0.5000 mg | INTRAMUSCULAR | Status: DC | PRN

## 2018-12-04 MED ORDER — POLYETHYLENE GLYCOL 3350 17 G PO PACK: 17.0000 g | PACK | Freq: Every day | ORAL | Status: DC | PRN

## 2018-12-04 MED ORDER — ACETAMINOPHEN 500 MG PO TABS
1000.0000 mg | ORAL_TABLET | Freq: Four times a day (QID) | ORAL | Status: AC
  Administered 2018-12-04 – 2018-12-05 (×3): 1000 mg via ORAL
  Filled 2018-12-04 (×4): qty 2

## 2018-12-04 MED ORDER — KETAMINE HCL 50 MG/5ML IJ SOSY
PREFILLED_SYRINGE | INTRAMUSCULAR | Status: AC
  Filled 2018-12-04: qty 5

## 2018-12-04 MED ORDER — DOCUSATE SODIUM 100 MG PO CAPS
100.0000 mg | ORAL_CAPSULE | Freq: Two times a day (BID) | ORAL | Status: DC
  Administered 2018-12-04: 100 mg via ORAL
  Filled 2018-12-04: qty 1

## 2018-12-04 MED ORDER — LACTATED RINGERS IV SOLN
INTRAVENOUS | Status: DC | PRN
  Administered 2018-12-04: 15:00:00 via INTRAVENOUS

## 2018-12-04 MED ORDER — METHOCARBAMOL 500 MG PO TABS
500.0000 mg | ORAL_TABLET | Freq: Four times a day (QID) | ORAL | Status: DC | PRN
  Administered 2018-12-04: 500 mg via ORAL
  Filled 2018-12-04 (×2): qty 1

## 2018-12-04 MED ORDER — CLONAZEPAM 1 MG PO TABS
1.0000 mg | ORAL_TABLET | Freq: Two times a day (BID) | ORAL | Status: DC
  Administered 2018-12-04 – 2018-12-07 (×6): 1 mg via ORAL
  Filled 2018-12-04 (×6): qty 1

## 2018-12-04 MED ORDER — HYDROMORPHONE HCL 1 MG/ML IJ SOLN
0.5000 mg | INTRAMUSCULAR | Status: DC | PRN
  Administered 2018-12-05: 0.5 mg via INTRAVENOUS
  Administered 2018-12-06: 1 mg via INTRAVENOUS
  Administered 2018-12-06: 0.5 mg via INTRAVENOUS
  Administered 2018-12-06: 1 mg via INTRAVENOUS
  Administered 2018-12-06: 0.5 mg via INTRAVENOUS
  Administered 2018-12-07 (×2): 1 mg via INTRAVENOUS
  Filled 2018-12-04 (×7): qty 1

## 2018-12-04 MED ORDER — SORBITOL 70 % SOLN
30.0000 mL | Freq: Every day | Status: DC | PRN
  Filled 2018-12-04: qty 30

## 2018-12-04 MED ORDER — DIPHENHYDRAMINE HCL 12.5 MG/5ML PO ELIX: 25.0000 mg | ORAL_SOLUTION | ORAL | Status: DC | PRN

## 2018-12-04 MED ORDER — KETOROLAC TROMETHAMINE 15 MG/ML IJ SOLN
15.0000 mg | Freq: Four times a day (QID) | INTRAMUSCULAR | Status: AC
  Administered 2018-12-04 – 2018-12-05 (×4): 15 mg via INTRAVENOUS
  Filled 2018-12-04 (×4): qty 1

## 2018-12-04 MED ORDER — ONDANSETRON HCL 4 MG/2ML IJ SOLN
4.0000 mg | Freq: Four times a day (QID) | INTRAMUSCULAR | Status: DC | PRN
  Administered 2018-12-04: 4 mg via INTRAVENOUS

## 2018-12-04 SURGICAL SUPPLY — 65 items
BANDAGE ESMARK 6X9 LF (GAUZE/BANDAGES/DRESSINGS) ×1 IMPLANT
BIT DRILL AO GAMMA 4.2X180 (BIT) ×1 IMPLANT
BIT DRILL FREE HAND 4.2X130 (DRILL) IMPLANT
BIT DRILL LOCK 4.2X360 (DRILL) IMPLANT
BNDG CMPR 9X6 STRL LF SNTH (GAUZE/BANDAGES/DRESSINGS) ×1
BNDG CMPR MED 10X6 ELC LF (GAUZE/BANDAGES/DRESSINGS) ×1
BNDG ELASTIC 4X5.8 VLCR STR LF (GAUZE/BANDAGES/DRESSINGS) ×2 IMPLANT
BNDG ELASTIC 6X10 VLCR STRL LF (GAUZE/BANDAGES/DRESSINGS) ×1 IMPLANT
BNDG ELASTIC 6X5.8 VLCR STR LF (GAUZE/BANDAGES/DRESSINGS) ×2 IMPLANT
BNDG ESMARK 6X9 LF (GAUZE/BANDAGES/DRESSINGS) ×2
COVER MAYO STAND STRL (DRAPES) ×2 IMPLANT
COVER SURGICAL LIGHT HANDLE (MISCELLANEOUS) ×2 IMPLANT
COVER WAND RF STERILE (DRAPES) ×2 IMPLANT
CUFF TOURN SGL QUICK 34 (TOURNIQUET CUFF) ×2
CUFF TRNQT CYL 34X4.125X (TOURNIQUET CUFF) ×1 IMPLANT
DRAPE C-ARM 42X72 X-RAY (DRAPES) ×2 IMPLANT
DRAPE C-ARMOR (DRAPES) ×2 IMPLANT
DRAPE HALF SHEET 40X57 (DRAPES) ×2 IMPLANT
DRAPE IMP U-DRAPE 54X76 (DRAPES) ×2 IMPLANT
DRAPE POUCH INSTRU U-SHP 10X18 (DRAPES) ×2 IMPLANT
DRAPE U-SHAPE 47X51 STRL (DRAPES) ×2 IMPLANT
DRAPE UTILITY XL STRL (DRAPES) ×4 IMPLANT
DRILL FREE HAND 4.2X130 (DRILL) ×2
DRILL LOCK 4.2X360 (DRILL) ×2
DRSG TEGADERM 4X4.75 (GAUZE/BANDAGES/DRESSINGS) ×2 IMPLANT
DURAPREP 26ML APPLICATOR (WOUND CARE) ×2 IMPLANT
ELECT CAUTERY BLADE 6.4 (BLADE) ×2 IMPLANT
ELECT REM PT RETURN 9FT ADLT (ELECTROSURGICAL) ×2
ELECTRODE REM PT RTRN 9FT ADLT (ELECTROSURGICAL) ×1 IMPLANT
FACESHIELD WRAPAROUND (MASK) IMPLANT
FACESHIELD WRAPAROUND OR TEAM (MASK) IMPLANT
GAUZE SPONGE 4X4 12PLY STRL (GAUZE/BANDAGES/DRESSINGS) ×2 IMPLANT
GAUZE XEROFORM 1X8 LF (GAUZE/BANDAGES/DRESSINGS) ×3 IMPLANT
GLOVE BIOGEL PI IND STRL 7.0 (GLOVE) ×1 IMPLANT
GLOVE BIOGEL PI INDICATOR 7.0 (GLOVE) ×1
GLOVE ECLIPSE 7.0 STRL STRAW (GLOVE) ×2 IMPLANT
GLOVE SKINSENSE NS SZ7.5 (GLOVE) ×4
GLOVE SKINSENSE STRL SZ7.5 (GLOVE) ×4 IMPLANT
GOWN STRL REIN XL XLG (GOWN DISPOSABLE) ×2 IMPLANT
GUIDEROD T2 3X1000 (ROD) ×1 IMPLANT
K-WIRE 3X285 (WIRE) ×2
K-WIRE FIXATION 3X285 COATED (WIRE) ×4
KIT BASIN OR (CUSTOM PROCEDURE TRAY) ×2 IMPLANT
KWIRE 3X285 (WIRE) IMPLANT
KWIRE FIXATION 3X285 COATED (WIRE) IMPLANT
MANIFOLD NEPTUNE II (INSTRUMENTS) ×2 IMPLANT
NAIL ALPHA TIB T2 10X375 (Nail) ×1 IMPLANT
NAIL ELAS INSERT SLV SPI 8-11 (MISCELLANEOUS) ×1 IMPLANT
NS IRRIG 1000ML POUR BTL (IV SOLUTION) ×2 IMPLANT
PACK TOTAL JOINT (CUSTOM PROCEDURE TRAY) ×2 IMPLANT
PACK UNIVERSAL I (CUSTOM PROCEDURE TRAY) ×2 IMPLANT
PAD CAST 4YDX4 CTTN HI CHSV (CAST SUPPLIES) ×2 IMPLANT
PADDING CAST COTTON 4X4 STRL (CAST SUPPLIES) ×4
SCREW LOCK T2 5X40 (Screw) ×1 IMPLANT
SCREW LOCK T2 5X42.5 (Screw) ×1 IMPLANT
SHAFT REAMER  8X885MM (MISCELLANEOUS) ×1
SHAFT REAMER 8X885MM (MISCELLANEOUS) IMPLANT
STAPLER SKIN PROX WIDE 3.9 (STAPLE) ×2 IMPLANT
STAPLER VISISTAT 35W (STAPLE) ×1 IMPLANT
SUT VIC AB 0 CT1 27 (SUTURE) ×4
SUT VIC AB 0 CT1 27XBRD ANTBC (SUTURE) ×2 IMPLANT
SUT VIC AB 2-0 CT1 27 (SUTURE) ×4
SUT VIC AB 2-0 CT1 TAPERPNT 27 (SUTURE) ×2 IMPLANT
TOWEL GREEN STERILE (TOWEL DISPOSABLE) ×4 IMPLANT
WATER STERILE IRR 1000ML POUR (IV SOLUTION) ×2 IMPLANT

## 2018-12-04 NOTE — Progress Notes (Signed)
30mg  of Ketamine given by Dr. Marcie Bal at this time.
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
## 2018-12-04 NOTE — Transfer of Care (Signed)
Immediate Anesthesia Transfer of Care Note  Patient: Patrick Cruz  Procedure(s) Performed: INTRAMEDULLARY (IM) NAIL TIBIAL (Left Leg Lower)  Patient Location: PACU  Anesthesia Type:General  Level of Consciousness: awake, alert  and oriented  Airway & Oxygen Therapy: Patient Spontanous Breathing and Patient connected to nasal cannula oxygen  Post-op Assessment: Report given to RN, Post -op Vital signs reviewed and stable and Patient moving all extremities X 4  Post vital signs: Reviewed and stable  Last Vitals:  Vitals Value Taken Time  BP 170/81 12/04/18 1617  Temp    Pulse 61 12/04/18 1622  Resp 14 12/04/18 1622  SpO2 100 % 12/04/18 1622  Vitals shown include unvalidated device data.  Last Pain:  Vitals:   12/04/18 1313  TempSrc: Oral  PainSc:       Patients Stated Pain Goal: 3 ([id number])  Complications: No apparent anesthesia complications

## 2018-12-04 NOTE — ED Notes (Signed)
Patient refused oxycodone and robaxin for pain. States the dilaudid did not help pain at all. Patient crying in room at this time. States "the medicine they gave me last night helped me." This nurse was told in report this morning that the patient was given fentanyl 100 mcg last night that helped patient's pain. Texted attending about order for fentanyl.

## 2018-12-04 NOTE — ED Notes (Addendum)
Patient woke and pushed call light, complaining of worsening pain. Gave pain scale of 8. PRN dilaudid to be given.
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
## 2018-12-04 NOTE — Anesthesia Preprocedure Evaluation (Signed)
Anesthesia Evaluation  Patient identified by MRN, date of birth, ID band Patient awake    Reviewed: Allergy & Precautions, NPO status , Patient's Chart, lab work & pertinent test results  Airway Mallampati: I       Dental no notable dental hx. (+) Teeth Intact   Pulmonary neg pulmonary ROS,    Pulmonary exam normal breath sounds clear to auscultation       Cardiovascular hypertension, Pt. on medications Normal cardiovascular exam Rhythm:Regular Rate:Normal     Neuro/Psych PSYCHIATRIC DISORDERS Anxiety Depression  Neuromuscular disease    GI/Hepatic negative GI ROS, (+)     substance abuse  ,   Endo/Other    Renal/GU negative Renal ROS  negative genitourinary   Musculoskeletal  (+) narcotic dependent  Abdominal Normal abdominal exam  (+)   Peds  Hematology  (+) Blood dyscrasia, anemia ,   Anesthesia Other Findings   Reproductive/Obstetrics                             Anesthesia Physical Anesthesia Plan  ASA: II  Anesthesia Plan: General   Post-op Pain Management:    Induction: Intravenous  PONV Risk Score and Plan: 3 and Ondansetron, Dexamethasone and Midazolam  Airway Management Planned: Oral ETT  Additional Equipment: None  Intra-op Plan:   Post-operative Plan: Extubation in OR  Informed Consent: I have reviewed the patients History and Physical, chart, labs and discussed the procedure including the risks, benefits and alternatives for the proposed anesthesia with the patient or authorized representative who has indicated his/her understanding and acceptance.     Dental advisory given  Plan Discussed with: CRNA  Anesthesia Plan Comments:         Anesthesia Quick Evaluation
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
## 2018-12-04 NOTE — Anesthesia Procedure Notes (Signed)
Procedure Name: Intubation Date/Time: 12/04/2018 2:55 PM Performed by: Mariea Clonts, CRNA Pre-anesthesia Checklist: Patient identified, Emergency Drugs available, Suction available and Patient being monitored Patient Re-evaluated:Patient Re-evaluated prior to induction Oxygen Delivery Method: Circle System Utilized Preoxygenation: Pre-oxygenation with 100% oxygen Induction Type: IV induction Ventilation: Mask ventilation without difficulty Laryngoscope Size: Mac and 4 Grade View: Grade I Tube type: Oral Tube size: 7.5 mm Number of attempts: 1 Airway Equipment and Method: Stylet and Oral airway Placement Confirmation: ETT inserted through vocal cords under direct vision,  positive ETCO2 and breath sounds checked- equal and bilateral Tube secured with: Tape Dental Injury: Teeth and Oropharynx as per pre-operative assessment

## 2018-12-04 NOTE — ED Notes (Signed)
[facility] at bedside to transport patient. 

## 2018-12-05 ENCOUNTER — Encounter (HOSPITAL_COMMUNITY): Payer: Self-pay | Admitting: Orthopaedic Surgery

## 2018-12-05 MED ORDER — ZINC SULFATE 220 (50 ZN) MG PO TABS: ORAL_TABLET | ORAL | 0 refills | Status: DC

## 2018-12-05 MED ORDER — POLYETHYLENE GLYCOL 3350 17 G PO PACK: 17.0000 g | PACK | Freq: Every day | ORAL | 0 refills | Status: DC | PRN

## 2018-12-05 MED ORDER — METHOCARBAMOL 500 MG PO TABS: 500.0000 mg | ORAL_TABLET | Freq: Three times a day (TID) | ORAL | 0 refills | Status: DC | PRN

## 2018-12-05 MED ORDER — ASPIRIN 81 MG PO TBEC: 81.0000 mg | DELAYED_RELEASE_TABLET | Freq: Two times a day (BID) | ORAL | 0 refills | Status: DC

## 2018-12-05 MED ORDER — OSCAL 500/200 D-3 500-200 MG-UNIT PO TABS: 1.0000 | ORAL_TABLET | Freq: Three times a day (TID) | ORAL | 3 refills | Status: DC

## 2018-12-05 MED ORDER — OXYCODONE HCL 5 MG PO TABS: 5.0000 mg | ORAL_TABLET | Freq: Four times a day (QID) | ORAL | 0 refills | Status: DC | PRN

## 2018-12-05 MED ORDER — ONDANSETRON HCL 4 MG PO TABS: 4.0000 mg | ORAL_TABLET | Freq: Four times a day (QID) | ORAL | 0 refills | Status: DC | PRN
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
## 2018-12-05 NOTE — Plan of Care (Deleted)

## 2018-12-05 NOTE — Progress Notes (Signed)
Subjective: 1 Day Post-Op Procedure(s) (LRB): INTRAMEDULLARY (IM) NAIL TIBIAL (Left) Patient reports pain as mild.  Resting comfortably  Objective: Vital signs in last 24 hours: Temp:  [97 F (36.1 C)-98.4 F (36.9 C)] 98.4 F (36.9 C) (11/03 0440) Pulse Rate:  [54-82] 58 (11/03 0440) Resp:  [12-24] 18 (11/03 0440) BP: (138-201)/(63-97) 144/66 (11/03 0440) SpO2:  [96 %-100 %] 100 % (11/03 0440) Weight:  [97.5 kg] 97.5 kg (11/02 1310)  Intake/Output from previous day: 11/02 0701 - 11/03 0700 In: 2098.9 [P.O.:560; I.V.:1400; IV Piggyback:138.9] Out: 575 [Urine:500; Blood:75] Intake/Output this shift: No intake/output data recorded.  Recent Labs    12/04/18 0226  HGB 11.6*   Recent Labs    12/04/18 0226  WBC 12.2*  RBC 4.19*  HCT 38.0*  PLT 318   Recent Labs    12/04/18 0226  NA 135  K 3.7  CL 102  CO2 26  BUN 19  CREATININE 1.19  GLUCOSE 137*  CALCIUM 8.4*   Recent Labs    12/04/18 0226  INR 1.1    Neurologically intact Neurovascular intact Sensation intact distally Intact pulses distally Dorsiflexion/Plantar flexion intact Incision: dressing C/D/I No cellulitis present Compartment soft   Assessment/Plan: 1 Day Post-Op Procedure(s) (LRB): INTRAMEDULLARY (IM) NAIL TIBIAL (Left) Advance diet Up with therapy  TDWB LLE ABLA- mild and stable Please elevate and apply ice to LLE at all times F/u with Dr. Erlinda Hong 2 weeks post-op      Aundra Dubin 12/05/2018, 7:47 AM

## 2018-12-05 NOTE — Progress Notes (Deleted)
RN reviewed AVS with pt and all questions answered to satisfaction. Pt belongings gathered and iv removed. Pt is ready for d/c. Smokey Melott Elon Spanner, RN 12/05/2018 5:53 PM
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
## 2018-12-05 NOTE — Discharge Instructions (Signed)
° ° °  1. Keep bandage clean and dry 2. Elevate foot above level of the heart 3. Take aspirin to prevent blood clots 4. Take pain meds as needed 5. Strict touch-down weight bearing to operative extremity

## 2018-12-06 ENCOUNTER — Inpatient Hospital Stay (HOSPITAL_COMMUNITY): Payer: Medicare HMO

## 2018-12-06 MED ORDER — BUPRENORPHINE HCL 2 MG SL SUBL: SUBLINGUAL_TABLET | SUBLINGUAL | 0 refills | Status: DC

## 2018-12-06 NOTE — Progress Notes (Addendum)
Subjective: 2 Days Post-Op Procedure(s) (LRB): INTRAMEDULLARY (IM) NAIL TIBIAL (Left) Patient reports pain as moderate.   Objective: Vital signs in last 24 hours: Temp:  [97.7 F (36.5 C)-98.4 F (36.9 C)] 98.4 F (36.9 C) (11/04 0413) Pulse Rate:  [60-88] 60 (11/04 0413) Resp:  [18-20] 20 (11/04 0413) BP: (141-151)/(69-75) 141/71 (11/04 0413) SpO2:  [95 %-98 %] 95 % (11/04 0413)  Intake/Output from previous day: 11/03 0701 - 11/04 0700 In: 2940.4 [P.O.:1940; I.V.:1000.4] Out: 2700 [Urine:2700] Intake/Output this shift: No intake/output data recorded.  Recent Labs    12/04/18 0226  HGB 11.6*   Recent Labs    12/04/18 0226  WBC 12.2*  RBC 4.19*  HCT 38.0*  PLT 318   Recent Labs    12/04/18 0226  NA 135  K 3.7  CL 102  CO2 26  BUN 19  CREATININE 1.19  GLUCOSE 137*  CALCIUM 8.4*   Recent Labs    12/04/18 0226  INR 1.1    Neurologically intact Neurovascular intact Sensation intact distally Intact pulses distally Dorsiflexion/Plantar flexion intact Incision: dressing C/D/I No cellulitis present Compartment soft   Assessment/Plan: 2 Days Post-Op Procedure(s) (LRB): INTRAMEDULLARY (IM) NAIL TIBIAL (Left) Up with therapy Discharge home with home health TDWB LLE ABLA- mild and stable Please elevate and apply ice to LLE at all times F/u with Dr. Erlinda Hong 2 weeks post-op     Aundra Dubin 12/06/2018, 8:46 AM

## 2018-12-06 NOTE — Progress Notes (Signed)
Patient has been very tearful and emotional since before shift change.  He is c/o all kinds of things involving his care.  During assessment patient stated his pain was only a two and refused pain meds.  However he keeps complaining that no one has been addressing his pain from before.  I explained that I am his nurse tonight and I am asking now if he needs anything for pain.  He can get his Oxy IR and his Robaxin.  He refused.  Patient c/o his iv hurting.  Nurse was in another room with another patient.  This patient pulled out his iv intentionally before nurse could get to the room. He stated that he was sorry but he wanted it out so he pulled it out. Now he is complaining that he wants another iv for his Dilaudid.
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
## 2018-12-06 NOTE — TOC Transition Note (Signed)
Transition of Care [facility]) - CM/SW Discharge Note   Patient Details  Name: MYLIK WOJTON MRN: [id number] Date of Birth: [birth year]  Transition of Care [facility]) CM/SW Contact:  Atilano Median, LCSW Phone Number: 12/06/2018, 12:26 PM   Clinical Narrative:     Anticipate discharge home today. PT recommending HHPT, patient agreeable to therapy but states he has all the DME recommended at home already. Referral made. No other needs at this time. Case closed to this CSW.      Patient Goals and CMS Choice   CMS Medicare.gov Compare Post Acute Care list provided to:: Patient Choice offered to / list presented to : Patient  Discharge Placement                       Discharge Plan and Services In-house Referral: Clinical Social Work   Post Acute Care Choice: Home Health                    HH Arranged: PT [facility] Agency: [place] Date [place]: 12/06/18 Time [place]: [id number] Representative spoke with at [place]: [place] Determinants of Health ([place]) Interventions     Readmission Risk Interventions No flowsheet data found.

## 2018-12-06 NOTE — Progress Notes (Signed)
PT Cancellation Note  Patient Details Name: Patrick Cruz MRN: [id number] DOB: [birth year]   Cancelled Treatment:    Reason Eval/Treat Not Completed: Other (comment)   Attempted PT session earlier (9:15), and he was in a lot of pain; Requested pain meds, and Manuela Schwartz, RN came in with morning meds and pain meds;  Opted to return later when pain meds are in effect;   Upon return (approx 10:05), Patrick Cruz was distraught, crying, refusing PT/mobilizing/practicing walking or stairs; Telling us he is too weak, and can't;  Continued to refuse despite our best efforts at encouragement, and education on the benefits of getting up and mobilizing; He asked to call his wife;   Ended our interaction, reconnected O2 sat monitor, elevated LLE; Patrick Cruz was talking with his wife on the phone when we left;   At one point, he indicated he may be "going through withdrawal" -- Discussed with RN;     Roney Marion, [place] Pager [phone] Office [phone]    Colletta [place] 12/06/2018, 10:47 AM
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
## 2018-12-06 NOTE — Plan of Care (Signed)

## 2018-12-07 DIAGNOSIS — R69 Illness, unspecified: Secondary | ICD-10-CM | POA: Diagnosis not present

## 2018-12-07 MED ORDER — TRAMADOL HCL 50 MG PO TABS: ORAL_TABLET | ORAL | 0 refills | Status: DC
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
## 2018-12-07 NOTE — Progress Notes (Signed)
Received a call from Dwana Melena, [place] who explained that she contacted Dr. Erlinda Hong to ensure pt still had a prescription for  Buprenorphine. Took the earlier prescription for Buprenorphone and shredded it per order from [facility].

## 2018-12-07 NOTE — Progress Notes (Signed)
Pt refused DME - Wheelchair because pt and family member did not wish to wait any longer for it to arrive. Social Worker notified and she said she would look into possible home delivery. All patient belongings sent home with patient and family, and discharge education completed earlier. Awaiting volunteer to roll patient off the unit.

## 2018-12-07 NOTE — Progress Notes (Signed)
Spoke with the patient and family member about their request for Subutex to be prescribed instead of Tramadol, in order for the pt to continue going to his regular treatment clinic.  Attempted to contact [facility] and I communicated their concern to Dwana Melena, [place].  Patient educated with discharge instructions, and all questions and concerns answered. Awaiting arrival of patient's DME, a wheelchair, and the arrival of his family member for transportation. Will continue to to monitor.

## 2018-12-07 NOTE — Progress Notes (Signed)
Subjective: 3 Days Post-Op Procedure(s) (LRB): INTRAMEDULLARY (IM) NAIL TIBIAL (Left) Patient reports pain as mild.  Feeling much better this am.    Objective: Vital signs in last 24 hours: Temp:  [97.3 F (36.3 C)-98.4 F (36.9 C)] 97.3 F (36.3 C) (11/05 0732) Pulse Rate:  [66-88] 66 (11/05 0732) Resp:  [16-18] 18 (11/05 0732) BP: (112-166)/(49-86) 166/86 (11/05 0732) SpO2:  [91 %-97 %] 95 % (11/05 0732)  Intake/Output from previous day: 11/04 0701 - 11/05 0700 In: 480 [P.O.:480] Out: 2850 [Urine:2850] Intake/Output this shift: No intake/output data recorded.  No results for input(s): HGB in the last 72 hours. No results for input(s): WBC, RBC, HCT, PLT in the last 72 hours. No results for input(s): NA, K, CL, CO2, BUN, CREATININE, GLUCOSE, CALCIUM in the last 72 hours. No results for input(s): LABPT, INR in the last 72 hours.  Neurologically intact Neurovascular intact Sensation intact distally Intact pulses distally Dorsiflexion/Plantar flexion intact Incision: dressing C/D/I No cellulitis present Compartment soft   Assessment/Plan: 3 Days Post-Op Procedure(s) (LRB): INTRAMEDULLARY (IM) NAIL TIBIAL (Left) Advance diet Up with therapy D/C IV fluids Discharge home with home health  ABLA- mild and stable Please elevate and apply ice to LLE at all times Patient refusing to go to SNF.  Has agreed to participate with PT today prior to d/c F/u with Dr. Erlinda Hong 2 weeks post-op      Aundra Dubin 12/07/2018, 7:55 AM

## 2018-12-07 NOTE — Anesthesia Postprocedure Evaluation (Signed)
Anesthesia Post Note  Patient: Patrick Cruz  Procedure(s) Performed: INTRAMEDULLARY (IM) NAIL TIBIAL (Left Leg Lower)     Patient location during evaluation: PACU Anesthesia Type: General Level of consciousness: awake and sedated Pain management: pain level controlled Vital Signs Assessment: post-procedure vital signs reviewed and stable Respiratory status: spontaneous breathing Cardiovascular status: stable Postop Assessment: no apparent nausea or vomiting Anesthetic complications: no    Last Vitals:  Vitals:   12/07/18 0427 12/07/18 0732  BP: (!) 146/78 (!) 166/86  Pulse: 66 66  Resp: 18 18  Temp: 36.7 C (!) 36.3 C  SpO2: 91% 95%    Last Pain:  Vitals:   12/07/18 0732  TempSrc: Oral  PainSc:    Pain Goal: Patients Stated Pain Goal: 3 (12/07/18 0656)                 Huston Foley

## 2018-12-07 NOTE — Progress Notes (Signed)
Patient received a new iv site and received his Dilaudid iv pain medication at 2266 on 12/06/2018 and he is currently resting peacefully.

## 2018-12-07 NOTE — Care Management Important Message (Signed)
Important Message  Patient Details  Name: Patrick Cruz MRN: [id number] Date of Birth: Dec 31, 1960   Medicare Important Message Given:  Yes     [name] 12/07/2018, 2:35 PM
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
## 2018-12-07 NOTE — Plan of Care (Signed)

## 2018-12-07 NOTE — Discharge Summary (Addendum)
Patient ID: Patrick Cruz MRN: [id number] DOB/AGE: 1960/08/05 58 y.o.  Admit date: 12/04/2018 Discharge date: 12/07/2018  Admission Diagnoses:  Principal Problem:   Tibia/fibula fracture, left, closed, initial encounter   Discharge Diagnoses:  Same  Past Medical History:  Diagnosis Date  . Arthritis   . Chronic back pain     Surgeries: Procedure(s): INTRAMEDULLARY (IM) NAIL TIBIAL on 12/04/2018   Consultants:   Discharged Condition: Improved  Hospital Course: Patrick Cruz is an 58 y.o. male who was admitted 12/04/2018 for operative treatment ofTibia/fibula fracture, left, closed, initial encounter. Patient has severe unremitting pain that affects sleep, daily activities, and work/hobbies. After pre-op clearance the patient was taken to the operating room on 12/04/2018 and underwent  Procedure(s): INTRAMEDULLARY (IM) NAIL TIBIAL.    Patient was given perioperative antibiotics:  Anti-infectives (From admission, onward)   Start     Dose/Rate Route Frequency Ordered Stop   12/04/18 2100  ceFAZolin (ANCEF) IVPB 2g/100 mL premix     2 g 200 mL/hr over 30 Minutes Intravenous Every 6 hours 12/04/18 1816 12/05/18 0933       Patient was given sequential compression devices, early ambulation, and chemoprophylaxis to prevent DVT.  Patient benefited maximally from hospital stay and there were no complications.    Recent vital signs:  Patient Vitals for the past 24 hrs:  BP Temp Temp src Pulse Resp SpO2  12/07/18 0732 (!) 166/86 (!) 97.3 F (36.3 C) Oral 66 18 95 %  12/07/18 0427 (!) 146/78 98.1 F (36.7 C) Oral 66 18 91 %  12/06/18 1955 (!) 147/74 98.4 F (36.9 C) Oral 72 18 95 %  12/06/18 1709 (!) 112/49 - - 88 18 96 %  12/06/18 1309 140/80 98.4 F (36.9 C) Oral 72 16 97 %     Recent laboratory studies: No results for input(s): WBC, HGB, HCT, PLT, NA, K, CL, CO2, BUN, CREATININE, GLUCOSE, INR, CALCIUM in the last 72 hours.  Invalid input(s): PT, 2   Discharge  Medications:   Allergies as of 12/07/2018   No Known Allergies     Medication List    STOP taking these medications   buprenorphine 2 MG Subl SL tablet Commonly known as: SUBUTEX   gabapentin 300 MG capsule Commonly known as: NEURONTIN   hydrochlorothiazide 12.5 MG tablet Commonly known as: HYDRODIURIL   ondansetron 4 MG disintegrating tablet Commonly known as: Zofran ODT     TAKE these medications   amphetamine-dextroamphetamine 30 MG tablet Commonly known as: ADDERALL Take 30 mg by mouth 3 (three) times daily.   aspirin 81 MG EC tablet Take 1 tablet (81 mg total) by mouth 2 (two) times daily.   clonazePAM 1 MG tablet Commonly known as: KLONOPIN Take 1 mg by mouth 2 (two) times daily.   methocarbamol 500 MG tablet Commonly known as: ROBAXIN Take 1 tablet (500 mg total) by mouth 3 (three) times daily as needed for muscle spasms.   ondansetron 4 MG tablet Commonly known as: ZOFRAN Take 1 tablet (4 mg total) by mouth every 6 (six) hours as needed for nausea.   Oscal 500/200 D-3 500-200 MG-UNIT tablet Generic drug: calcium-vitamin D Take 1 tablet by mouth 3 (three) times daily.   PARoxetine 30 MG tablet Commonly known as: PAXIL Take 60 mg by mouth at bedtime.   polyethylene glycol 17 g packet Commonly known as: MIRALAX / GLYCOLAX Take 17 g by mouth daily as needed for mild constipation.   traMADol 50 MG tablet Commonly known  as: Ultram Take 1-2 tabs po tid prn pain   Zinc Sulfate 220 (50 Zn) MG Tabs Take once daily x 6 weeks            Durable Medical Equipment  (From admission, onward)         Start     Ordered   12/06/18 0850  For home use only DME standard manual wheelchair with seat cushion  Once    Comments: Patient suffers from tibia fracture which impairs their ability to perform daily activities like bathing, dressing and toileting in the home.  A cane, crutch or walker will not resolve issue with performing activities of daily living. A  wheelchair will allow patient to safely perform daily activities. Patient can safely propel the wheelchair in the home or has a caregiver who can provide assistance. Length of need 6 months . Accessories: elevating leg rests (ELRs), wheel locks, extensions and anti-tippers.   12/06/18 0850   12/04/18 1817  DME Walker rolling  Once    Question:  Patient needs a walker to treat with the following condition  Answer:  History of open reduction and internal fixation (ORIF) procedure   12/04/18 1816   12/04/18 1817  DME 3 n 1  Once     12/04/18 1816   12/04/18 1817  DME Bedside commode  Once    Question:  Patient needs a bedside commode to treat with the following condition  Answer:  History of open reduction and internal fixation (ORIF) procedure   12/04/18 1816          Diagnostic Studies: Dg Tibia/fibula Left  Result Date: 12/06/2018 CLINICAL DATA:  Postoperative radiographs EXAM: LEFT TIBIA AND FIBULA - 2 VIEW COMPARISON:  Radiographs 12/04/2018 FINDINGS: Patient is post ORIF of the displaced fracture of the distal tibial diaphysis with placement of an intramedullary rod secured proximally and distally with fully threaded screws. Alignment across the fracture site is near anatomic post reduction. Extensive soft tissue edema is again noted. Vascular calcifications are present. IMPRESSION: Post ORIF of the distal tibial diaphyseal fracture. No acute hardware complication. Satisfactory alignment post reduction. Electronically Signed   By: Lovena Le M.D.   On: 12/06/2018 21:40   Dg Tibia/fibula Left  Result Date: 12/04/2018 CLINICAL DATA:  Intramedullary nail left tibia for fracture fixation. EXAM: DG C-ARM 1-60 MIN; LEFT TIBIA AND FIBULA - 2 VIEW CONTRAST:  None. FLUOROSCOPY TIME:  Fluoroscopy Time:  0 minutes 48 seconds Radiation Exposure Index (if provided by the fluoroscopic device): Unknown Number of Acquired Spot Images: 7 COMPARISON:  12/04/2018 earlier. FINDINGS: Images demonstrate a  intramedullary nail over the left tibia bridging patient's distal diaphyseal fracture with anatomic alignment over the fracture site. The intramedullary nail with proximal and distal screws is intact and normally located. Recommend correlation with findings at the time of the procedure. IMPRESSION: Hardware intact post intramedullary nail fixation of distal tibial diaphyseal fracture. Electronically Signed   By: Marin Olp M.D.   On: 12/04/2018 16:25   Dg Tibia/fibula Left  Result Date: 12/04/2018 CLINICAL DATA:  Pain EXAM: LEFT TIBIA AND FIBULA - 2 VIEW COMPARISON:  None. FINDINGS: There is extensive left lower extremity edema. There is a displaced fracture of the distal tibial diaphysis. There are advanced degenerative changes of the partially visualized left knee. IMPRESSION: Displaced fracture of the mid to distal tibia Electronically Signed   By: Constance Holster M.D.   On: 12/04/2018 01:39   Dg Ankle Complete Left  Result Date: 12/04/2018  CLINICAL DATA:  Pain EXAM: LEFT ANKLE COMPLETE - 3+ VIEW COMPARISON:  None. FINDINGS: There is a displaced fracture of the mid to distal tibia with surrounding soft tissue swelling. There are degenerative changes of the mortise joint. There is no dislocation. There is a moderate-sized plantar calcaneal spur. There are degenerative changes of the subtalar joint. IMPRESSION: Displaced fracture of the mid to distal tibia. Electronically Signed   By: Constance Holster M.D.   On: 12/04/2018 01:40   Dg C-arm 1-60 Min  Result Date: 12/04/2018 CLINICAL DATA:  Intramedullary nail left tibia for fracture fixation. EXAM: DG C-ARM 1-60 MIN; LEFT TIBIA AND FIBULA - 2 VIEW CONTRAST:  None. FLUOROSCOPY TIME:  Fluoroscopy Time:  0 minutes 48 seconds Radiation Exposure Index (if provided by the fluoroscopic device): Unknown Number of Acquired Spot Images: 7 COMPARISON:  12/04/2018 earlier. FINDINGS: Images demonstrate a intramedullary nail over the left tibia bridging  patient's distal diaphyseal fracture with anatomic alignment over the fracture site. The intramedullary nail with proximal and distal screws is intact and normally located. Recommend correlation with findings at the time of the procedure. IMPRESSION: Hardware intact post intramedullary nail fixation of distal tibial diaphyseal fracture. Electronically Signed   By: Marin Olp M.D.   On: 12/04/2018 16:25    Disposition: Discharge disposition: 01-Home or Self Care         Follow-up Information    Leandrew Koyanagi, MD. Schedule an appointment as soon as possible for a visit in 2 week(s).   Specialty: Orthopedic Surgery Contact information: [place] [address] [phone]            Signed: Aundra Dubin 12/07/2018, 10:58 AM

## 2018-12-08 ENCOUNTER — Telehealth: Payer: Self-pay | Admitting: Orthopaedic Surgery

## 2018-12-08 ENCOUNTER — Other Ambulatory Visit: Payer: Self-pay | Admitting: Orthopaedic Surgery

## 2018-12-08 MED ORDER — OXYCODONE-ACETAMINOPHEN 5-325 MG PO TABS: 1.0000 | ORAL_TABLET | Freq: Four times a day (QID) | ORAL | 0 refills | Status: DC | PRN

## 2018-12-08 NOTE — Telephone Encounter (Signed)
Please advise 

## 2018-12-08 NOTE — Telephone Encounter (Signed)
Pt called in to request a refill on his pain medication Oxycodone, he is running out today and wont have enough through the weekend and needs this to be refilled. Please have that sent to [facility] [address] in [place].  [phone]

## 2018-12-08 NOTE — Telephone Encounter (Signed)
Patient's wife called requesting an RX refill on his oxycodone.  She stated that he is in bad shape and not able to get up to go to the bathroom because of the pain.  She is wanting to know if he can get something stronger.  CB#[phone].  Thank you.

## 2018-12-08 NOTE — Telephone Encounter (Signed)
Rx sent 

## 2018-12-08 NOTE — Telephone Encounter (Signed)
I called patient and advised. 

## 2018-12-08 NOTE — Telephone Encounter (Signed)
Duplicate message. 

## 2018-12-08 NOTE — Telephone Encounter (Signed)
Could you please advise since Dr. Eileen Stanford are out of the office?

## 2018-12-09 ENCOUNTER — Encounter (HOSPITAL_COMMUNITY): Payer: Self-pay | Admitting: Emergency Medicine

## 2018-12-09 ENCOUNTER — Other Ambulatory Visit: Payer: Self-pay

## 2018-12-09 DIAGNOSIS — R5381 Other malaise: Secondary | ICD-10-CM | POA: Diagnosis not present

## 2018-12-09 DIAGNOSIS — I959 Hypotension, unspecified: Secondary | ICD-10-CM | POA: Diagnosis not present

## 2018-12-09 DIAGNOSIS — I10 Essential (primary) hypertension: Secondary | ICD-10-CM | POA: Diagnosis not present

## 2018-12-09 DIAGNOSIS — G8929 Other chronic pain: Secondary | ICD-10-CM | POA: Diagnosis not present

## 2018-12-09 DIAGNOSIS — R52 Pain, unspecified: Secondary | ICD-10-CM | POA: Diagnosis not present

## 2018-12-09 DIAGNOSIS — M79605 Pain in left leg: Secondary | ICD-10-CM | POA: Diagnosis present

## 2018-12-09 DIAGNOSIS — M7989 Other specified soft tissue disorders: Secondary | ICD-10-CM | POA: Diagnosis not present

## 2018-12-09 DIAGNOSIS — G8928 Other chronic postprocedural pain: Secondary | ICD-10-CM | POA: Diagnosis not present

## 2018-12-09 DIAGNOSIS — S82202A Unspecified fracture of shaft of left tibia, initial encounter for closed fracture: Secondary | ICD-10-CM | POA: Diagnosis not present

## 2018-12-09 DIAGNOSIS — R110 Nausea: Secondary | ICD-10-CM | POA: Diagnosis not present

## 2018-12-09 NOTE — ED Triage Notes (Signed)
Pt from home via [facility]. Pt had recent surgery on tib/fib fx. Pt reports uncontrollable pain. Pt reports taking all of his percocet he was prescribed wit no relief. Pt also taking tramadol with no relief.

## 2018-12-10 ENCOUNTER — Emergency Department (HOSPITAL_COMMUNITY): Payer: Medicare HMO

## 2018-12-10 ENCOUNTER — Emergency Department (HOSPITAL_COMMUNITY)
Admission: EM | Admit: 2018-12-10 | Discharge: 2018-12-10 | Disposition: A | Discharge status: Home / Self Care | Payer: Medicare HMO | Attending: Emergency Medicine | Admitting: Emergency Medicine

## 2018-12-10 DIAGNOSIS — S82202A Unspecified fracture of shaft of left tibia, initial encounter for closed fracture: Secondary | ICD-10-CM | POA: Diagnosis not present

## 2018-12-10 DIAGNOSIS — G8918 Other acute postprocedural pain: Secondary | ICD-10-CM

## 2018-12-10 MED ORDER — KETOROLAC TROMETHAMINE 30 MG/ML IJ SOLN
30.0000 mg | Freq: Once | INTRAMUSCULAR | Status: AC
  Administered 2018-12-10: 30 mg via INTRAMUSCULAR
  Filled 2018-12-10: qty 1

## 2018-12-10 MED ORDER — ONDANSETRON HCL 4 MG PO TABS: 4.0000 mg | ORAL_TABLET | Freq: Three times a day (TID) | ORAL | 0 refills | Status: DC | PRN

## 2018-12-10 NOTE — ED Notes (Signed)
Patient transported to X-ray 

## 2018-12-10 NOTE — Discharge Instructions (Addendum)
Use the Zofran so you can take your Percocet that you already have for pain.  Follow-up with your orthopedist for your postsurgical checkups.  You will need to contact the doctor who is writing your buprenorphine to have more prescribed.  I am not licensed to write this for chronic pain.  Elevate your leg, use ice packs for swelling.  Use the crutches since you are unable to use a wheelchair in your household.
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
## 2018-12-10 NOTE — ED Notes (Signed)
ED Provider at bedside. 

## 2018-12-11 ENCOUNTER — Other Ambulatory Visit: Payer: Self-pay | Admitting: Physician Assistant

## 2018-12-11 NOTE — Telephone Encounter (Signed)
Dr. Junius Roads did send in #20 on Friday.  FYI--pt was seen in ED yesterday.

## 2018-12-11 NOTE — Telephone Encounter (Signed)
Ok to do

## 2018-12-11 NOTE — Telephone Encounter (Signed)
Looks like hilts sent this in on friday

## 2018-12-11 NOTE — Telephone Encounter (Signed)
Did hilts already send in some?

## 2018-12-12 ENCOUNTER — Telehealth: Payer: Self-pay | Admitting: Orthopaedic Surgery

## 2018-12-12 MED ORDER — KETOROLAC TROMETHAMINE 10 MG PO TABS: 10.0000 mg | ORAL_TABLET | Freq: Two times a day (BID) | ORAL | 0 refills | Status: DC | PRN

## 2018-12-12 NOTE — Telephone Encounter (Signed)
Pt wife deborah called in left vm said her husband is in extreme pain, had surgery about 4 days. Pt knows he cannot put any weight on the wound site but pt states it is hard for him to be careful do to barely being able to keep himself up when walking and putting all of his weight on that side. His wife is very concerned about him and she's wondering if he cant get something stronger for pain medication? Also she is wondering if there is a possibility someone can come out to pt home to evaluate and treat him, due to him not being able to walk she doesn't know how she would get him here.   [phone]

## 2018-12-12 NOTE — Telephone Encounter (Signed)
Ok, please let him know that the provider that writes his subutex has agreed to continue writing this.  Needs to check with her for refill

## 2018-12-12 NOTE — Telephone Encounter (Signed)
I called, no answer. Will try again

## 2018-12-12 NOTE — Telephone Encounter (Signed)
See message below °

## 2018-12-12 NOTE — Telephone Encounter (Signed)
Yes we can definitely order HHPT for him.  I sent in toradol for him.

## 2018-12-13 ENCOUNTER — Telehealth: Payer: Self-pay | Admitting: Orthopaedic Surgery

## 2018-12-13 NOTE — Telephone Encounter (Signed)
Received a Staff message regarding HHPT   Vescio, Bufford Spikes, RMA        " I followed up on this patient and it looks like there were [place] orders placed at [facility] when he was hospitalized there. Our department started a referral to [facility] and it appears that there was a miscommunication so the [facility] never got started. [facility] has started the insurance authorization now and they are planning to see patient as soon as they get the authorization. "

## 2018-12-13 NOTE — TOC Progression Note (Signed)
12/13/18:  A call was routed to this LCSW yesterday, 12/12/18 at 4:30PM, from [place]. Pt had been calling his surgeon's office to inform that he needs help at home. Reviewed pt's record from his surgical hospitalization at [facility] and noted that [facility] had documented DC plan that included [facility] referral to [facility]. Verified that [place] orders had been entered.   Contacted Tresea Mall with [facility] who followed up with Malachy Mood who is the [employer] at [facility] to inquire as Santiago Glad could not find pt in their system. Per Burman Foster stated that the referral had never been fully completed to them by TOC. Malachy Mood stated that she was waiting on a return call from [facility] confirming that pt was okay with the co-pays he would be responsible for paying to them.   Per Burman Foster did contact pt yesterday evening and he indicated he was agreeable to the co-pays and [facility] is working on [employer] authorization. [facility] planning to see pt as soon as they get the authorization which they anticipate will be later today. It sounded like pt was appreciative and comfortable.  There was no other TOC need identified at this time.

## 2018-12-13 NOTE — Telephone Encounter (Signed)
I called, no answer.  Sending to you as I have been unable to reach patient. I did not see post op appointment scheduled for him.

## 2018-12-13 NOTE — Telephone Encounter (Signed)
Sonia Side with [facility] called stating that the patient's insurance is out of network with them and they will not be able to see him.  He does appreciate the referral.  CB#[phone].  Thank you.

## 2018-12-13 NOTE — Telephone Encounter (Signed)
Tried calling patient no answer LMOM.

## 2018-12-13 NOTE — Telephone Encounter (Signed)
Tried calling patient no answer LMOM. Needs to also schedule Post op appt.

## 2018-12-13 NOTE — Telephone Encounter (Signed)
Sonia Side called and  States they cannot see patient. Public librarian. He suggested we send patient to [facility] care.

## 2018-12-14 ENCOUNTER — Telehealth: Payer: Self-pay | Admitting: Orthopaedic Surgery

## 2018-12-14 DIAGNOSIS — S89192D Other physeal fracture of lower end of left tibia, subsequent encounter for fracture with routine healing: Secondary | ICD-10-CM | POA: Diagnosis not present

## 2018-12-14 DIAGNOSIS — M1990 Unspecified osteoarthritis, unspecified site: Secondary | ICD-10-CM | POA: Diagnosis not present

## 2018-12-14 DIAGNOSIS — M549 Dorsalgia, unspecified: Secondary | ICD-10-CM | POA: Diagnosis not present

## 2018-12-14 DIAGNOSIS — I10 Essential (primary) hypertension: Secondary | ICD-10-CM | POA: Diagnosis not present

## 2018-12-14 NOTE — Telephone Encounter (Signed)
[name] from [employer] called wanted weight baring status. Please call @ [phone]

## 2018-12-14 NOTE — Telephone Encounter (Signed)
Not quite sure what to do about all of his pain because he has history of substance abuse and addiction and chronic pain.  I can increase his Percocet just for a few days if he wants that.  Otherwise I would suggest going to the ER for stronger pain medicines.

## 2018-12-14 NOTE — Telephone Encounter (Signed)
Please advise 

## 2018-12-14 NOTE — Telephone Encounter (Signed)
Yes can resume HCTZ.  Sounds like he's very non compliant.  I cannot prescribe subutex as this has to come from his clinic.  He's probably swollen because he's not elevating it.

## 2018-12-14 NOTE — Telephone Encounter (Signed)
[name] PT Michela Pitcher went to see him. He Ran out of Oxy and was prescribed Toradol (not helping).  Going to pain center everyday and gets one pill a day (Subutex). PT wants to know if Dr Erlinda Hong can prescribe this so that he doesn't have to leave everyday to pain clinic. She states he is  Leaving the house everyday. He has been WB per therapist. Therapist is really concerned about this. +Swelling, BP elevated. HCTZ can he resume this Rx?

## 2018-12-14 NOTE — Telephone Encounter (Signed)
See message below °

## 2018-12-14 NOTE — Telephone Encounter (Signed)
Patient's wife [name] called advised patient took (PT) today and is in a lot of pain. She said patient was sent home without any supplies. [name] said the (PT) was also concerned about the pain the patient is in. The number to contact [name] is [phone] or [phone]

## 2018-12-14 NOTE — Telephone Encounter (Signed)
Non weight bearing

## 2018-12-14 NOTE — Telephone Encounter (Signed)
Called [name] back to advise.

## 2018-12-15 NOTE — Telephone Encounter (Signed)
I called patient's wife and advised of message regarding Subutex, HCTZ, and elevation.

## 2018-12-15 NOTE — Telephone Encounter (Signed)
FYI Removed voicemail from triage phone this morning that was left from patient yesterday afternoon. He states that he is having to put weight on his leg to go up and down the stairs, and that he has to do this to get to [facility] daily to get his Subutex.  He is requesting Dr. Erlinda Hong write rx for this so he does not have to be on his leg.  I see that Dr. Erlinda Hong has addressed in prior message.

## 2018-12-15 NOTE — Telephone Encounter (Signed)
I made patient post op appt.

## 2018-12-19 ENCOUNTER — Encounter: Payer: Self-pay | Admitting: Orthopaedic Surgery

## 2018-12-19 ENCOUNTER — Other Ambulatory Visit: Payer: Self-pay

## 2018-12-19 ENCOUNTER — Ambulatory Visit (INDEPENDENT_AMBULATORY_CARE_PROVIDER_SITE_OTHER): Payer: Medicare HMO | Admitting: Physician Assistant

## 2018-12-19 ENCOUNTER — Telehealth: Payer: Self-pay | Admitting: Orthopaedic Surgery

## 2018-12-19 ENCOUNTER — Ambulatory Visit (HOSPITAL_COMMUNITY)
Admission: RE | Admit: 2018-12-19 | Discharge: 2018-12-19 | Disposition: A | Discharge status: Home / Self Care | Payer: Medicare HMO | Source: Ambulatory Visit | Attending: Orthopaedic Surgery | Admitting: Orthopaedic Surgery

## 2018-12-19 ENCOUNTER — Ambulatory Visit (INDEPENDENT_AMBULATORY_CARE_PROVIDER_SITE_OTHER): Payer: Medicare HMO

## 2018-12-19 DIAGNOSIS — M79605 Pain in left leg: Secondary | ICD-10-CM

## 2018-12-19 NOTE — Progress Notes (Signed)
Please let him know its neg

## 2018-12-19 NOTE — Progress Notes (Signed)
   Post-Op Visit Note   Patient: Patrick Cruz           Date of Birth: [birth year]           MRN: [id number] Visit Date: 12/19/2018 PCP: Andria Frames, PA-C   Assessment & Plan:  Chief Complaint:  Chief Complaint  Patient presents with  . Left Leg - Pain, Routine Post Op   Visit Diagnoses:  1. Pain in left leg     Plan: Patient is a pleasant 58 year old gentleman who presents our clinic today 2 weeks status post left tibial intramedullary nail, date of surgery 12/04/2018.  He has been compliant touchdown weightbearing.  He has not been elevating his leg very much.  He has not been taking his aspirin which was prescribed at the hospital.  He does have moderate pain to the left lower extremity.  No chest pain or shortness of breath.  No history of DVT.  Examination of his left lower extremity reveals significant swelling with evidence of venous stasis.  He does have tenderness to the calf.  Negative Homans.  He has well-healing surgical incisions with staples intact.  At this point, we will remove the staples and apply Steri-Strips.  We will order a stat ultrasound of the left lower extremity to rule out DVT.  He will need to start taking aspirin 81 mg twice daily at this point.  Should he have a DVT we will alter his anticoagulant.  We will place him in a cam walker touchdown weightbearing.  Follow-up with [place] in 4 weeks time for repeat evaluation and x-rays of the tibia/fibula.  Follow-Up Instructions: Return in about 4 weeks (around 01/16/2019).   Orders:  Orders Placed This Encounter  Procedures  . XR Tibia/Fibula Left  . VAS [place] LOWER EXTREMITY VENOUS (DVT)   No orders of the defined types were placed in this encounter.   Imaging: Xr Tibia/fibula Left  Result Date: 12/19/2018 X-rays demonstrate stable alignment of the fracture without interval change   PMFS History: Patient Active Problem List   Diagnosis Date Noted  . Tibia/fibula fracture, left, closed, initial  encounter 12/04/2018  . 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 Hypertensive urgency 06/08/2018  . 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 Lumbar radiculopathy, chronic 06/08/2018  . Anemia 06/08/2018  . Abnormal liver function 06/08/2018   Past Medical History:  Diagnosis Date  . Arthritis   . Chronic back pain     Family History  Problem Relation Age of Onset  . Obesity Other     Past Surgical History:  Procedure Laterality Date  . KNEE ARTHROSCOPY W/ DEBRIDEMENT    . TIBIA IM NAIL INSERTION Left 12/04/2018   Procedure: INTRAMEDULLARY (IM) NAIL TIBIAL;  Surgeon: Leandrew Koyanagi, MD;  Location: [facility];  Service: Orthopedics;  Laterality: Left;   Social History   Occupational History  . Not on file  Tobacco Use  . Smoking status: Never Smoker  . Smokeless tobacco: Never Used  Substance and Sexual Activity  . Alcohol use: Never    Frequency: Never  . Drug use: Not Currently  . Sexual activity: Not on file

## 2018-12-19 NOTE — Telephone Encounter (Signed)
Patient left a voicemail message stating that the [place] test was negative.  He is also returning a call.  [id number].  Thank you.

## 2018-12-19 NOTE — Progress Notes (Signed)
Left lower extremity venous duplex has been completed. Preliminary results can be found in CV Proc through chart review.  Results were given to [facility] at Dr. Phoebe Sharps office.  12/19/18 1:51 PM Patrick Cruz RVT

## 2018-12-20 NOTE — Telephone Encounter (Signed)
See other messages

## 2018-12-21 ENCOUNTER — Telehealth: Payer: Self-pay

## 2018-12-21 ENCOUNTER — Inpatient Hospital Stay: Payer: Medicare HMO | Admitting: Orthopaedic Surgery

## 2018-12-21 NOTE — Telephone Encounter (Signed)
Please advise on message below.   Notes recorded by Precious Bard, RMA on 12/20/2018 at 11:20 AM EST    Called patient and advised on U/S Results and also on Aspirin.   States he needs note saying he had SU and then they will give him a week supply of his pain meds.   Okay to do this?     [facility]   [facility]: [id number]       ------   Notes recorded by Precious Bard, RMA on 12/19/2018 at 2:20 PM EST  Called patient no answer LMOM to return call. Also, per Mendel Ryder he needs to take Aspirin 81 mg BID. We will see him back at his next Appt.  ------   Notes recorded by Leandrew Koyanagi, MD on 12/19/2018 at 2:07 PM EST  Please let him know its neg

## 2018-12-21 NOTE — Telephone Encounter (Signed)
yes

## 2018-12-22 ENCOUNTER — Emergency Department (HOSPITAL_COMMUNITY)
Admission: EM | Admit: 2018-12-22 | Discharge: 2018-12-22 | Disposition: A | Discharge status: Home / Self Care | Payer: Medicare HMO | Attending: Emergency Medicine | Admitting: Emergency Medicine

## 2018-12-22 ENCOUNTER — Other Ambulatory Visit: Payer: Self-pay

## 2018-12-22 ENCOUNTER — Encounter (HOSPITAL_COMMUNITY): Payer: Self-pay | Admitting: Emergency Medicine

## 2018-12-22 DIAGNOSIS — F419 Anxiety disorder, unspecified: Secondary | ICD-10-CM | POA: Diagnosis not present

## 2018-12-22 DIAGNOSIS — Z79899 Other long term (current) drug therapy: Secondary | ICD-10-CM | POA: Insufficient documentation

## 2018-12-22 DIAGNOSIS — Z7982 Long term (current) use of aspirin: Secondary | ICD-10-CM | POA: Insufficient documentation

## 2018-12-22 DIAGNOSIS — R69 Illness, unspecified: Secondary | ICD-10-CM | POA: Diagnosis not present

## 2018-12-22 MED ORDER — HYDROXYZINE HCL 25 MG PO TABS: 25.0000 mg | ORAL_TABLET | Freq: Four times a day (QID) | ORAL | 0 refills | Status: DC | PRN

## 2018-12-22 MED ORDER — HYDROXYZINE HCL 25 MG PO TABS
25.0000 mg | ORAL_TABLET | Freq: Once | ORAL | Status: AC
  Administered 2018-12-22: 25 mg via ORAL
  Filled 2018-12-22: qty 1

## 2018-12-22 NOTE — Discharge Instructions (Addendum)
Take vistaril as needed for anxiety.  Find a primary care provider for further management of your chronic health condition as well as management of your medications

## 2018-12-22 NOTE — Telephone Encounter (Signed)
Letter made ready for pick up. Called patient no answer. LMOM

## 2018-12-22 NOTE — ED Triage Notes (Signed)
Pt reports recent surgery on leg. Pt states all his medications are messed up. States his PCP doesn't know anything and hasn't seen her in a month. Reports he hasn't slept in 4 days due to anxiety.

## 2018-12-22 NOTE — ED Provider Notes (Signed)
[facility] EMERGENCY DEPARTMENT Provider Note   CSN: [id number] Arrival date & time: 12/22/18  1452     History   Chief Complaint Chief Complaint  Patient presents with  . Anxiety    HPI Patrick Cruz is a 58 y.o. male.     The history is provided by the patient and medical records. No language interpreter was used.  Anxiety     58 year old male with history of chronic back pain, presenting complaining of feeling anxious.  Patient states he recently broke his left tibia requiring surgical repair by Dr. Erlinda Hong approximately 2 weeks ago.  States when he was discharged, his medication was mixed up.  Therefore, he has been taking a few of his medication and states for the past 4 days he has difficulties sleeping, feeling confused, stuffy ears, as well as feeling anxious.  He denies any significant pain to his left leg no chest pain or shortness of breath or fever or chills.  He felt that he does not have a good relationship with his PCP and is currently trying to find a new provider to manage his overall health.  He is here requesting for assistance with his anxiety and difficulty sleeping.  He denies similar symptoms in the past.  He does attend a pain clinic.  Denies any viral symptoms.  Past Medical History:  Diagnosis Date  . Arthritis   . Chronic back pain     Patient Active Problem List   Diagnosis Date Noted  . Tibia/fibula fracture, left, closed, initial encounter 12/04/2018  . 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 Hypertensive urgency 06/08/2018  . 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 Lumbar radiculopathy, chronic 06/08/2018  . Anemia 06/08/2018  . Abnormal liver function 06/08/2018    Past Surgical History:  Procedure Laterality Date  . KNEE ARTHROSCOPY W/ DEBRIDEMENT    . TIBIA IM NAIL INSERTION Left 12/04/2018   Procedure: INTRAMEDULLARY (IM) NAIL TIBIAL;  Surgeon: Leandrew Koyanagi, MD;  Location: [facility];  Service: Orthopedics;  Laterality:  Left;        Home Medications    Prior to Admission medications   Medication Sig Start Date End Date Taking? Authorizing Provider  amphetamine-dextroamphetamine (ADDERALL) 30 MG tablet Take 30 mg by mouth 3 (three) times daily.    [provider]  aspirin EC 81 MG EC tablet Take 1 tablet (81 mg total) by mouth 2 (two) times daily. 12/05/18   Aundra Dubin, PA-C  calcium-vitamin D (OSCAL 500/200 D-3) 500-200 MG-UNIT tablet Take 1 tablet by mouth 3 (three) times daily. 12/05/18 12/05/19  Aundra Dubin, PA-C  clonazePAM (KLONOPIN) 1 MG tablet Take 1 mg by mouth 2 (two) times daily. 12/07/17   [provider]  ketorolac (TORADOL) 10 MG tablet Take 1 tablet (10 mg total) by mouth 2 (two) times daily as needed. 12/12/18   Leandrew Koyanagi, MD  methocarbamol (ROBAXIN) 500 MG tablet Take 1 tablet (500 mg total) by mouth 3 (three) times daily as needed for muscle spasms. 12/05/18   Aundra Dubin, PA-C  ondansetron (ZOFRAN) 4 MG tablet Take 1 tablet (4 mg total) by mouth every 8 (eight) hours as needed for nausea or vomiting. 12/10/18   Rolland Porter, MD  oxyCODONE-acetaminophen (PERCOCET/ROXICET) 5-325 MG tablet Take 1 tablet by mouth every 6 (six) hours as needed for severe pain. 12/08/18   Hilts, Legrand Como, MD  PARoxetine (PAXIL) 30 MG tablet Take 60 mg by mouth at bedtime.  10/25/17  [provider]  polyethylene glycol (MIRALAX / GLYCOLAX) 17 g packet Take 17 g by mouth daily as needed for mild constipation. 12/05/18   Aundra Dubin, PA-C  traMADol Veatrice Bourbon) 50 MG tablet Take 1-2 tabs po tid prn pain 12/07/18   Aundra Dubin, PA-C  Zinc Sulfate 220 (50 Zn) MG TABS Take once daily x 6 weeks 12/05/18   Aundra Dubin, PA-C    Family History Family History  Problem Relation Age of Onset  . Obesity Other     Social History Social History   Tobacco Use  . Smoking status: Never Smoker  . Smokeless tobacco: Never Used  Substance Use Topics  . Alcohol use: Never     Frequency: Never  . Drug use: Not Currently     Allergies   Patient has no known allergies.   Review of Systems Review of Systems  All other systems reviewed and are negative.    Physical Exam Updated Vital Signs BP (!) 174/106   Pulse 100   Temp 98.2 F (36.8 C) (Oral)   Resp 20   Ht 6' (1.829 m)   Wt 97.5 kg   SpO2 97%   BMI 29.16 kg/m   Physical Exam Vitals signs and nursing note reviewed.  Constitutional:      General: He is not in acute distress.    Appearance: He is well-developed.  HENT:     Head: Atraumatic.  Eyes:     Conjunctiva/sclera: Conjunctivae normal.  Neck:     Musculoskeletal: Neck supple.  Cardiovascular:     Rate and Rhythm: Normal rate and regular rhythm.     Pulses: Normal pulses.     Heart sounds: Normal heart sounds.  Pulmonary:     Effort: Pulmonary effort is normal.  Skin:    Findings: No rash.  Neurological:     Mental Status: He is alert and oriented to person, place, and time.  Psychiatric:     Comments: Mildly anxious, pressured speech.      ED Treatments / Results  Labs (all labs ordered are listed, but only abnormal results are displayed) Labs Reviewed - No data to display  EKG None  Radiology No results found.  Procedures Procedures (including critical care time)  Medications Ordered in ED Medications - No data to display   Initial Impression / Assessment and Plan / ED Course  I have reviewed the triage vital signs and the nursing notes.  Pertinent labs & imaging results that were available during my care of the patient were reviewed by me and considered in my medical decision making (see chart for details).        BP (!) 174/106   Pulse 100   Temp 98.2 F (36.8 C) (Oral)   Resp 20   Ht 6' (1.829 m)   Wt 97.5 kg   SpO2 97%   BMI 29.16 kg/m    Final Clinical Impressions(s) / ED Diagnoses   Final diagnoses:  Anxiety    ED Discharge Orders         Ordered    hydrOXYzine  (ATARAX/VISTARIL) 25 MG tablet  Every 6 hours PRN     12/22/18 1620         4:18 PM Patient report not taking his home medication as well as feeling anxious.  Recently had surgery to his left leg from a tibial fracture.  His symptom does not suggest underlying PE or DVT causing anxiousness.  He endorsed having difficulty sleeping but  not due to pain.  Will provide Vistaril for anxiety but encourage patient to follow-up closely with PCP for further management of his medication.  Resources provided.   Domenic Moras, PA-C 12/22/18 1621    Drenda Freeze, MD 12/22/18 [phone]

## 2018-12-25 ENCOUNTER — Telehealth: Payer: Self-pay | Admitting: Orthopaedic Surgery

## 2018-12-25 NOTE — Telephone Encounter (Signed)
He can do PT.  He's TDWB.  Ankle and knee ROM ok.

## 2018-12-25 NOTE — Telephone Encounter (Signed)
Billie Ruddy physical therapist called in to verify some things, she said that pt states Dr.Xu said not to participate in pt? If so she needs to know how long dr.xu was looking to hold the pt. Pt also told her that a prescription for pain medication was suppose to sent in for him?  [phone]

## 2018-12-25 NOTE — Telephone Encounter (Signed)
See message.

## 2018-12-27 NOTE — Telephone Encounter (Signed)
Called Rodena Piety to advise.

## 2019-01-02 DIAGNOSIS — R69 Illness, unspecified: Secondary | ICD-10-CM | POA: Diagnosis not present

## 2019-01-16 ENCOUNTER — Ambulatory Visit (INDEPENDENT_AMBULATORY_CARE_PROVIDER_SITE_OTHER): Payer: Medicare HMO | Admitting: Orthopaedic Surgery

## 2019-01-16 ENCOUNTER — Other Ambulatory Visit: Payer: Self-pay

## 2019-01-16 ENCOUNTER — Ambulatory Visit (INDEPENDENT_AMBULATORY_CARE_PROVIDER_SITE_OTHER): Payer: Medicare HMO

## 2019-01-16 ENCOUNTER — Encounter: Payer: Self-pay | Admitting: Orthopaedic Surgery

## 2019-01-16 DIAGNOSIS — S82402A Unspecified fracture of shaft of left fibula, initial encounter for closed fracture: Secondary | ICD-10-CM

## 2019-01-16 DIAGNOSIS — S82202A Unspecified fracture of shaft of left tibia, initial encounter for closed fracture: Secondary | ICD-10-CM | POA: Diagnosis not present

## 2019-01-16 NOTE — Progress Notes (Signed)
   Post-Op Visit Note   Patient: Patrick Cruz           Date of Birth: 04-16-1960           MRN: [id number] Visit Date: 01/16/2019 PCP: Andria Frames, PA-C   Assessment & Plan:  Chief Complaint:  Chief Complaint  Patient presents with  . Left Leg - Follow-up, Routine Post Op   Visit Diagnoses:  1. Tibia/fibula fracture, left, closed, initial encounter     Plan: Patient is 6 weeks status post IM nailing of the left tibia fracture.  He is doing well overall.  He is significantly better today.  He is not taking anything for pain.  He is ambulating with a single-point cane.  His surgical scars are fully healed.  He does have some mild swelling in his left lower extremity.  He does have chronic venous stasis at baseline.  Today he is doing very well.  X-rays demonstrate significant healing.  At this point we will advance him to weight-bear as tolerated to the left lower extremity.  Internal referral to physical therapy was made.  Recheck in 6 weeks with two-view x-rays of the left tib-fib.  Follow-Up Instructions: Return in about 6 weeks (around 02/27/2019).   Orders:  Orders Placed This Encounter  Procedures  . XR Tibia/Fibula Left  . Ambulatory referral to Physical Therapy   No orders of the defined types were placed in this encounter.   Imaging: XR Tibia/Fibula Left  Result Date: 01/16/2019 Stable alignment and hardware of left tibia fracture.  There is evidence of abundant callus formation   PMFS History: Patient Active Problem List   Diagnosis Date Noted  . Tibia/fibula fracture, left, closed, initial encounter 12/04/2018  . 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 Hypertensive urgency 06/08/2018  . 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 Lumbar radiculopathy, chronic 06/08/2018  . Anemia 06/08/2018  . Abnormal liver function 06/08/2018   Past Medical History:  Diagnosis Date  . Arthritis   . Chronic back pain     Family History  Problem Relation Age of  Onset  . Obesity Other     Past Surgical History:  Procedure Laterality Date  . KNEE ARTHROSCOPY W/ DEBRIDEMENT    . TIBIA IM NAIL INSERTION Left 12/04/2018   Procedure: INTRAMEDULLARY (IM) NAIL TIBIAL;  Surgeon: Leandrew Koyanagi, MD;  Location: [facility];  Service: Orthopedics;  Laterality: Left;   Social History   Occupational History  . Not on file  Tobacco Use  . Smoking status: Never Smoker  . Smokeless tobacco: Never Used  Substance and Sexual Activity  . Alcohol use: Never  . Drug use: Not Currently  . Sexual activity: Not on file

## 2019-01-18 ENCOUNTER — Ambulatory Visit (HOSPITAL_COMMUNITY): Payer: No Typology Code available for payment source | Admitting: Physical Therapy

## 2019-01-18 ENCOUNTER — Telehealth (HOSPITAL_COMMUNITY): Payer: Self-pay | Admitting: Physical Therapy

## 2019-01-18 NOTE — Telephone Encounter (Signed)
lmonvm to cancel today's appt due to his wife had to work he wants to reschedule.

## 2019-02-27 ENCOUNTER — Ambulatory Visit (INDEPENDENT_AMBULATORY_CARE_PROVIDER_SITE_OTHER): Payer: Medicare HMO

## 2019-02-27 ENCOUNTER — Other Ambulatory Visit: Payer: Self-pay

## 2019-02-27 ENCOUNTER — Ambulatory Visit (INDEPENDENT_AMBULATORY_CARE_PROVIDER_SITE_OTHER): Payer: Medicare HMO | Admitting: Orthopaedic Surgery

## 2019-02-27 ENCOUNTER — Encounter: Payer: Self-pay | Admitting: Orthopaedic Surgery

## 2019-02-27 DIAGNOSIS — S82202A Unspecified fracture of shaft of left tibia, initial encounter for closed fracture: Secondary | ICD-10-CM

## 2019-02-27 DIAGNOSIS — S82402A Unspecified fracture of shaft of left fibula, initial encounter for closed fracture: Secondary | ICD-10-CM

## 2019-02-27 NOTE — Progress Notes (Signed)
   Post-Op Visit Note   Patient: Patrick Cruz           Date of Birth: January 07, 1961           MRN: [id number] Visit Date: 02/27/2019 PCP: Andria Frames, PA-C   Assessment & Plan:  Chief Complaint:  Chief Complaint  Patient presents with  . Left Leg - Pain   Visit Diagnoses:  1. Tibia/fibula fracture, left, closed, initial encounter     Plan: Left tibia IM nail.  He is doing very well and reports no pain.  He has recently been climbing up into a tree to cut down branches and he has not had any issues with this.  He is ambulating without any assistive devices.  He does have some baseline pitting edema which she states is normal.  He has no tenderness palpation.  X-rays demonstrate significant callus formation and bridging bony consolidation.  At this point we will release him to activity as tolerated.  Follow-up as needed.  Follow-Up Instructions: Return if symptoms worsen or fail to improve.   Orders:  Orders Placed This Encounter  Procedures  . XR Tibia/Fibula Left   No orders of the defined types were placed in this encounter.   Imaging: XR Tibia/Fibula Left  Result Date: 02/27/2019 Abundant callus formation and significant consolidation fracture   PMFS History: Patient Active Problem List   Diagnosis Date Noted  . Tibia/fibula fracture, left, closed, initial encounter 12/04/2018  . 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 Hypertensive urgency 06/08/2018  . 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 Lumbar radiculopathy, chronic 06/08/2018  . Anemia 06/08/2018  . Abnormal liver function 06/08/2018   Past Medical History:  Diagnosis Date  . Arthritis   . Chronic back pain     Family History  Problem Relation Age of Onset  . Obesity Other     Past Surgical History:  Procedure Laterality Date  . KNEE ARTHROSCOPY W/ DEBRIDEMENT    . TIBIA IM NAIL INSERTION Left 12/04/2018   Procedure: INTRAMEDULLARY (IM) NAIL TIBIAL;  Surgeon: Leandrew Koyanagi, MD;  Location: [facility];  Service: Orthopedics;  Laterality: Left;   Social History   Occupational History  . Not on file  Tobacco Use  . Smoking status: Never Smoker  . Smokeless tobacco: Never Used  Substance and Sexual Activity  . Alcohol use: Never  . Drug use: Not Currently  . Sexual activity: Not on file

## 2019-03-06 DIAGNOSIS — I10 Essential (primary) hypertension: Secondary | ICD-10-CM | POA: Diagnosis not present

## 2019-03-06 DIAGNOSIS — H9193 Unspecified hearing loss, bilateral: Secondary | ICD-10-CM | POA: Diagnosis not present

## 2019-03-14 DIAGNOSIS — H9312 Tinnitus, left ear: Secondary | ICD-10-CM | POA: Diagnosis not present

## 2019-03-14 DIAGNOSIS — H905 Unspecified sensorineural hearing loss: Secondary | ICD-10-CM | POA: Diagnosis not present

## 2019-03-14 DIAGNOSIS — H903 Sensorineural hearing loss, bilateral: Secondary | ICD-10-CM | POA: Diagnosis not present

## 2019-03-20 ENCOUNTER — Other Ambulatory Visit: Payer: Self-pay | Admitting: Physician Assistant

## 2019-03-20 DIAGNOSIS — H905 Unspecified sensorineural hearing loss: Secondary | ICD-10-CM

## 2019-03-20 DIAGNOSIS — H903 Sensorineural hearing loss, bilateral: Secondary | ICD-10-CM

## 2019-03-20 DIAGNOSIS — H9312 Tinnitus, left ear: Secondary | ICD-10-CM

## 2019-03-22 ENCOUNTER — Inpatient Hospital Stay: Admission: RE | Admit: 2019-03-22 | Payer: Medicare HMO | Source: Ambulatory Visit

## 2019-03-28 ENCOUNTER — Other Ambulatory Visit: Payer: Self-pay | Admitting: Physician Assistant

## 2019-03-28 ENCOUNTER — Other Ambulatory Visit (HOSPITAL_COMMUNITY): Payer: Self-pay | Admitting: Physician Assistant

## 2019-03-28 DIAGNOSIS — H9122 Sudden idiopathic hearing loss, left ear: Secondary | ICD-10-CM

## 2019-03-28 DIAGNOSIS — H9312 Tinnitus, left ear: Secondary | ICD-10-CM

## 2019-03-29 DIAGNOSIS — R69 Illness, unspecified: Secondary | ICD-10-CM | POA: Diagnosis not present

## 2019-04-25 ENCOUNTER — Ambulatory Visit (HOSPITAL_COMMUNITY)
Admission: RE | Admit: 2019-04-25 | Discharge: 2019-04-25 | Disposition: A | Discharge status: Home / Self Care | Payer: Medicare HMO | Source: Ambulatory Visit | Attending: Physician Assistant | Admitting: Physician Assistant

## 2019-04-25 ENCOUNTER — Other Ambulatory Visit: Payer: Self-pay

## 2019-04-25 DIAGNOSIS — H9122 Sudden idiopathic hearing loss, left ear: Secondary | ICD-10-CM | POA: Insufficient documentation

## 2019-04-25 DIAGNOSIS — H9313 Tinnitus, bilateral: Secondary | ICD-10-CM | POA: Diagnosis not present

## 2019-04-25 DIAGNOSIS — H9312 Tinnitus, left ear: Secondary | ICD-10-CM | POA: Diagnosis present

## 2019-04-25 LAB — POCT I-STAT CREATININE: Creatinine, Ser: 1.1 mg/dL (ref 0.61–1.24)

## 2019-04-25 MED ORDER — GADOBUTROL 1 MMOL/ML IV SOLN
10.0000 mL | Freq: Once | INTRAVENOUS | Status: AC | PRN
  Administered 2019-04-25: 10 mL via INTRAVENOUS

## 2019-04-27 ENCOUNTER — Ambulatory Visit: Payer: Medicare HMO | Attending: Internal Medicine

## 2019-04-27 DIAGNOSIS — Z23 Encounter for immunization: Secondary | ICD-10-CM

## 2019-04-27 NOTE — Progress Notes (Signed)
   Covid-19 Vaccination Clinic  Name:  JESSTIN FECHNER    MRN: [id number] DOB: 1960-08-30  04/27/2019  Mr. Rater was observed post Covid-19 immunization for 15 minutes .  During the observation period, he experienced an adverse reaction with the following symptoms:  Tired feeling, eyes burning, flushed feeling.  Assessment : Time of assessment 0912.  Actions taken:  BP 162/79, P 82, 97% O2 saturation. He was placed in a wheelchair and taken out to the triage area by [facility]. Blood sugar 122. 12 Lead EKG done, patient monitored additonal 15 minutes and reported feeling better. Last BP 165/82, P 83. He was able to get off the stretcher and ambulate to the door to go home with his wife. Instructed to call 911 if symptoms reappear, she verbalized understanding.  Medications administered:None  Disposition: Home with wife.   Immunizations Administered    Name Date Dose VIS Date Route   Moderna COVID-19 Vaccine 04/27/2019  9:01 AM 0.5 mL 01/02/2019 Intramuscular   Manufacturer: Moderna   Lot: [id number]   [id number]

## 2019-05-29 ENCOUNTER — Ambulatory Visit: Payer: Medicare HMO

## 2019-05-31 DIAGNOSIS — R69 Illness, unspecified: Secondary | ICD-10-CM | POA: Diagnosis not present

## 2019-07-31 DIAGNOSIS — R69 Illness, unspecified: Secondary | ICD-10-CM | POA: Diagnosis not present

## 2019-08-07 DIAGNOSIS — R69 Illness, unspecified: Secondary | ICD-10-CM | POA: Diagnosis not present

## 2019-08-29 DIAGNOSIS — R69 Illness, unspecified: Secondary | ICD-10-CM | POA: Diagnosis not present

## 2019-09-10 DIAGNOSIS — R69 Illness, unspecified: Secondary | ICD-10-CM | POA: Diagnosis not present

## 2019-10-11 DIAGNOSIS — R69 Illness, unspecified: Secondary | ICD-10-CM | POA: Diagnosis not present

## 2019-10-31 ENCOUNTER — Emergency Department (HOSPITAL_COMMUNITY): Payer: Medicare HMO

## 2019-10-31 ENCOUNTER — Inpatient Hospital Stay (HOSPITAL_COMMUNITY)
Admission: EM | Admit: 2019-10-31 | Discharge: 2019-11-14 | DRG: 956 | Disposition: A | Discharge status: Skilled Nursing Facility | Payer: Medicare HMO | Attending: Surgery | Admitting: Surgery

## 2019-10-31 ENCOUNTER — Encounter (HOSPITAL_COMMUNITY): Payer: Self-pay

## 2019-10-31 ENCOUNTER — Other Ambulatory Visit: Payer: Self-pay

## 2019-10-31 DIAGNOSIS — I700 Atherosclerosis of aorta: Secondary | ICD-10-CM | POA: Diagnosis not present

## 2019-10-31 DIAGNOSIS — S72142A Displaced intertrochanteric fracture of left femur, initial encounter for closed fracture: Secondary | ICD-10-CM

## 2019-10-31 DIAGNOSIS — M7989 Other specified soft tissue disorders: Secondary | ICD-10-CM | POA: Diagnosis not present

## 2019-10-31 DIAGNOSIS — S299XXA Unspecified injury of thorax, initial encounter: Secondary | ICD-10-CM | POA: Diagnosis not present

## 2019-10-31 DIAGNOSIS — T07XXXA Unspecified multiple injuries, initial encounter: Secondary | ICD-10-CM | POA: Diagnosis not present

## 2019-10-31 DIAGNOSIS — E278 Other specified disorders of adrenal gland: Secondary | ICD-10-CM | POA: Diagnosis not present

## 2019-10-31 DIAGNOSIS — D72829 Elevated white blood cell count, unspecified: Secondary | ICD-10-CM | POA: Diagnosis not present

## 2019-10-31 DIAGNOSIS — Y92410 Unspecified street and highway as the place of occurrence of the external cause: Secondary | ICD-10-CM

## 2019-10-31 DIAGNOSIS — I10 Essential (primary) hypertension: Secondary | ICD-10-CM | POA: Diagnosis not present

## 2019-10-31 DIAGNOSIS — S82491A Other fracture of shaft of right fibula, initial encounter for closed fracture: Secondary | ICD-10-CM | POA: Diagnosis not present

## 2019-10-31 DIAGNOSIS — F419 Anxiety disorder, unspecified: Secondary | ICD-10-CM | POA: Diagnosis present

## 2019-10-31 DIAGNOSIS — S36892A Contusion of other intra-abdominal organs, initial encounter: Secondary | ICD-10-CM | POA: Diagnosis present

## 2019-10-31 DIAGNOSIS — Z20822 Contact with and (suspected) exposure to covid-19: Secondary | ICD-10-CM | POA: Diagnosis present

## 2019-10-31 DIAGNOSIS — S7292XA Unspecified fracture of left femur, initial encounter for closed fracture: Secondary | ICD-10-CM | POA: Diagnosis not present

## 2019-10-31 DIAGNOSIS — S82831A Other fracture of upper and lower end of right fibula, initial encounter for closed fracture: Secondary | ICD-10-CM | POA: Diagnosis not present

## 2019-10-31 DIAGNOSIS — S72009A Fracture of unspecified part of neck of unspecified femur, initial encounter for closed fracture: Secondary | ICD-10-CM

## 2019-10-31 DIAGNOSIS — T148XXA Other injury of unspecified body region, initial encounter: Secondary | ICD-10-CM

## 2019-10-31 DIAGNOSIS — S82201A Unspecified fracture of shaft of right tibia, initial encounter for closed fracture: Secondary | ICD-10-CM

## 2019-10-31 DIAGNOSIS — R0902 Hypoxemia: Secondary | ICD-10-CM | POA: Diagnosis not present

## 2019-10-31 DIAGNOSIS — S0003XA Contusion of scalp, initial encounter: Secondary | ICD-10-CM | POA: Diagnosis not present

## 2019-10-31 DIAGNOSIS — T1490XA Injury, unspecified, initial encounter: Secondary | ICD-10-CM

## 2019-10-31 DIAGNOSIS — S82301A Unspecified fracture of lower end of right tibia, initial encounter for closed fracture: Secondary | ICD-10-CM | POA: Diagnosis not present

## 2019-10-31 DIAGNOSIS — S2249XA Multiple fractures of ribs, unspecified side, initial encounter for closed fracture: Secondary | ICD-10-CM

## 2019-10-31 DIAGNOSIS — I2510 Atherosclerotic heart disease of native coronary artery without angina pectoris: Secondary | ICD-10-CM | POA: Diagnosis not present

## 2019-10-31 DIAGNOSIS — D62 Acute posthemorrhagic anemia: Secondary | ICD-10-CM | POA: Diagnosis present

## 2019-10-31 DIAGNOSIS — S2242XA Multiple fractures of ribs, left side, initial encounter for closed fracture: Secondary | ICD-10-CM | POA: Diagnosis present

## 2019-10-31 DIAGNOSIS — S72102A Unspecified trochanteric fracture of left femur, initial encounter for closed fracture: Secondary | ICD-10-CM | POA: Diagnosis not present

## 2019-10-31 DIAGNOSIS — R0689 Other abnormalities of breathing: Secondary | ICD-10-CM | POA: Diagnosis not present

## 2019-10-31 DIAGNOSIS — S60512A Abrasion of left hand, initial encounter: Secondary | ICD-10-CM | POA: Diagnosis present

## 2019-10-31 DIAGNOSIS — R339 Retention of urine, unspecified: Secondary | ICD-10-CM | POA: Diagnosis not present

## 2019-10-31 DIAGNOSIS — S72002A Fracture of unspecified part of neck of left femur, initial encounter for closed fracture: Secondary | ICD-10-CM

## 2019-10-31 DIAGNOSIS — M19011 Primary osteoarthritis, right shoulder: Secondary | ICD-10-CM | POA: Diagnosis not present

## 2019-10-31 DIAGNOSIS — E559 Vitamin D deficiency, unspecified: Secondary | ICD-10-CM | POA: Diagnosis present

## 2019-10-31 DIAGNOSIS — R918 Other nonspecific abnormal finding of lung field: Secondary | ICD-10-CM | POA: Diagnosis not present

## 2019-10-31 DIAGNOSIS — I77810 Thoracic aortic ectasia: Secondary | ICD-10-CM | POA: Diagnosis present

## 2019-10-31 DIAGNOSIS — M25511 Pain in right shoulder: Secondary | ICD-10-CM | POA: Diagnosis present

## 2019-10-31 DIAGNOSIS — S72092A Other fracture of head and neck of left femur, initial encounter for closed fracture: Secondary | ICD-10-CM | POA: Diagnosis present

## 2019-10-31 DIAGNOSIS — R52 Pain, unspecified: Secondary | ICD-10-CM | POA: Diagnosis not present

## 2019-10-31 DIAGNOSIS — S82251A Displaced comminuted fracture of shaft of right tibia, initial encounter for closed fracture: Secondary | ICD-10-CM | POA: Diagnosis not present

## 2019-10-31 DIAGNOSIS — J8489 Other specified interstitial pulmonary diseases: Secondary | ICD-10-CM | POA: Diagnosis not present

## 2019-10-31 DIAGNOSIS — D7389 Other diseases of spleen: Secondary | ICD-10-CM | POA: Diagnosis not present

## 2019-10-31 DIAGNOSIS — S82451A Displaced comminuted fracture of shaft of right fibula, initial encounter for closed fracture: Secondary | ICD-10-CM | POA: Diagnosis present

## 2019-10-31 DIAGNOSIS — S82401A Unspecified fracture of shaft of right fibula, initial encounter for closed fracture: Secondary | ICD-10-CM

## 2019-10-31 DIAGNOSIS — J3489 Other specified disorders of nose and nasal sinuses: Secondary | ICD-10-CM | POA: Diagnosis not present

## 2019-10-31 DIAGNOSIS — S0081XA Abrasion of other part of head, initial encounter: Secondary | ICD-10-CM | POA: Diagnosis present

## 2019-10-31 DIAGNOSIS — S3991XA Unspecified injury of abdomen, initial encounter: Secondary | ICD-10-CM | POA: Diagnosis not present

## 2019-10-31 DIAGNOSIS — S82291A Other fracture of shaft of right tibia, initial encounter for closed fracture: Secondary | ICD-10-CM | POA: Diagnosis not present

## 2019-10-31 DIAGNOSIS — Z419 Encounter for procedure for purposes other than remedying health state, unspecified: Secondary | ICD-10-CM

## 2019-10-31 DIAGNOSIS — I6529 Occlusion and stenosis of unspecified carotid artery: Secondary | ICD-10-CM | POA: Diagnosis not present

## 2019-10-31 HISTORY — DX: Other injury of unspecified body region, initial encounter: T14.8XXA

## 2019-10-31 HISTORY — DX: Anxiety disorder, unspecified: F41.9

## 2019-10-31 LAB — I-STAT CHEM 8, ED
BUN: 20 mg/dL (ref 8–23)
Calcium, Ion: 1.08 mmol/L — ABNORMAL LOW (ref 1.15–1.40)
Chloride: 106 mmol/L (ref 98–111)
Creatinine, Ser: 1.2 mg/dL (ref 0.61–1.24)
Glucose, Bld: 113 mg/dL — ABNORMAL HIGH (ref 70–99)
HCT: 35 % — ABNORMAL LOW (ref 39.0–52.0)
Hemoglobin: 11.9 g/dL — ABNORMAL LOW (ref 13.0–17.0)
Potassium: 3.8 mmol/L (ref 3.5–5.1)
Sodium: 140 mmol/L (ref 135–145)
TCO2: 22 mmol/L (ref 22–32)

## 2019-10-31 LAB — SAMPLE TO BLOOD BANK

## 2019-10-31 LAB — COMPREHENSIVE METABOLIC PANEL
ALT: 25 U/L (ref 0–44)
AST: 37 U/L (ref 15–41)
Albumin: 3.7 g/dL (ref 3.5–5.0)
Alkaline Phosphatase: 114 U/L (ref 38–126)
Anion gap: 11 (ref 5–15)
BUN: 16 mg/dL (ref 8–23)
CO2: 20 mmol/L — ABNORMAL LOW (ref 22–32)
Calcium: 8.6 mg/dL — ABNORMAL LOW (ref 8.9–10.3)
Chloride: 104 mmol/L (ref 98–111)
Creatinine, Ser: 1.15 mg/dL (ref 0.61–1.24)
GFR calc Af Amer: >60 mL/min (ref >60)
GFR calc non Af Amer: >60 mL/min (ref >60)
Glucose, Bld: 115 mg/dL — ABNORMAL HIGH (ref 70–99)
Potassium: 3.8 mmol/L (ref 3.5–5.1)
Sodium: 135 mmol/L (ref 135–145)
Total Bilirubin: 0.4 mg/dL (ref 0.3–1.2)
Total Protein: 7 g/dL (ref 6.5–8.1)

## 2019-10-31 LAB — CBC
HCT: 36.9 % — ABNORMAL LOW (ref 39.0–52.0)
Hemoglobin: 11.6 g/dL — ABNORMAL LOW (ref 13.0–17.0)
MCH: 28 pg (ref 26.0–34.0)
MCHC: 31.4 g/dL (ref 30.0–36.0)
MCV: 89.1 fL (ref 80.0–100.0)
Platelets: 288 10*3/uL (ref 150–400)
RBC: 4.14 MIL/uL — ABNORMAL LOW (ref 4.22–5.81)
RDW: 12.8 % (ref 11.5–15.5)
WBC: 20.2 10*3/uL — ABNORMAL HIGH (ref 4.0–10.5)
nRBC: 0 % (ref 0.0–0.2)

## 2019-10-31 LAB — URINALYSIS, ROUTINE W REFLEX MICROSCOPIC
Bacteria, UA: NONE SEEN
Bilirubin Urine: NEGATIVE
Glucose, UA: NEGATIVE mg/dL
Ketones, ur: NEGATIVE mg/dL
Leukocytes,Ua: NEGATIVE
Nitrite: NEGATIVE
Protein, ur: NEGATIVE mg/dL
Specific Gravity, Urine: 1.024 (ref 1.005–1.030)
pH: 6 (ref 5.0–8.0)

## 2019-10-31 LAB — PROTIME-INR
INR: 1.1 (ref 0.8–1.2)
Prothrombin Time: 13.4 seconds (ref 11.4–15.2)

## 2019-10-31 LAB — LACTIC ACID, PLASMA: Lactic Acid, Venous: 2.1 mmol/L (ref 0.5–1.9)

## 2019-10-31 LAB — ETHANOL: Alcohol, Ethyl (B): <10 mg/dL (ref <10)

## 2019-10-31 MED ORDER — HYDROMORPHONE HCL 1 MG/ML IJ SOLN
1.0000 mg | INTRAMUSCULAR | Status: AC | PRN
  Administered 2019-10-31 – 2019-11-01 (×4): 1 mg via INTRAVENOUS
  Filled 2019-10-31 (×3): qty 1

## 2019-10-31 MED ORDER — HYDROMORPHONE HCL 1 MG/ML IJ SOLN
2.0000 mg | Freq: Once | INTRAMUSCULAR | Status: AC
  Administered 2019-10-31: 2 mg via INTRAVENOUS
  Filled 2019-10-31: qty 2

## 2019-10-31 MED ORDER — IOHEXOL 300 MG/ML  SOLN
100.0000 mL | Freq: Once | INTRAMUSCULAR | Status: AC | PRN
  Administered 2019-10-31: 100 mL via INTRAVENOUS

## 2019-10-31 MED ORDER — HYDROMORPHONE HCL 1 MG/ML IJ SOLN
INTRAMUSCULAR | Status: AC
  Administered 2019-10-31: 1 mg via INTRAVENOUS
  Filled 2019-10-31: qty 1

## 2019-10-31 MED ORDER — HYDROMORPHONE HCL 1 MG/ML IJ SOLN: 1.0000 mg | Freq: Once | INTRAMUSCULAR | Status: AC
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
## 2019-10-31 NOTE — ED Notes (Signed)
[place] MD made aware of pt co and assessment of short leg splint. Awaiting ortho arrival at this time.

## 2019-10-31 NOTE — ED Notes (Signed)
Patient transported to CT 

## 2019-10-31 NOTE — ED Notes (Signed)
Mottling noted to bilateral lower extremities, pt actively crying out in pain. Pt provided analgesic see MAR, pt provided education on opiate analgesic, 2L O2 placed on pt at this time.

## 2019-10-31 NOTE — ED Notes (Addendum)
Pt repositioned in bed in attempt to position of comfort for pt. Pt crying out in pain, co of discomfort to splint to right leg, cap refill >2 secs. Pt able to wiggle toes, but reports less movement post splint, extremity cool to touch. Othro tech paged at this time to come reevaluate splint.

## 2019-10-31 NOTE — ED Notes (Signed)
Vanita Panda MD gave verbal order for Diluaid given at this time
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
## 2019-10-31 NOTE — ED Notes (Signed)
Pt screaming during portable XRAY this RN administered Dilaudid without relief. MD at beside.

## 2019-10-31 NOTE — Anesthesia Preprocedure Evaluation (Addendum)
Anesthesia Evaluation  Patient identified by MRN, date of birth, ID band Patient awake    Reviewed: Allergy & Precautions, NPO status , Patient's Chart, lab work & pertinent test results  Airway Mallampati: II  TM Distance: >3 FB Neck ROM: Full    Dental  (+) Dental Advisory Given, Poor Dentition, Missing, Chipped,    Pulmonary neg pulmonary ROS,    Pulmonary exam normal breath sounds clear to auscultation       Cardiovascular negative cardio ROS Normal cardiovascular exam Rhythm:Regular Rate:Normal     Neuro/Psych PSYCHIATRIC DISORDERS Anxiety negative neurological ROS     GI/Hepatic negative GI ROS, Neg liver ROS,   Endo/Other  negative endocrine ROS  Renal/GU negative Renal ROS     Musculoskeletal negative musculoskeletal ROS (+)   Abdominal (+) + obese,   Peds  Hematology negative hematology ROS (+)   Anesthesia Other Findings   Reproductive/Obstetrics                            Anesthesia Physical Anesthesia Plan  ASA: II  Anesthesia Plan: General   Post-op Pain Management:    Induction: Intravenous  PONV Risk Score and Plan: 3 and Ondansetron, Dexamethasone, Midazolam and Treatment may vary due to age or medical condition  Airway Management Planned: Oral ETT  Additional Equipment: None  Intra-op Plan:   Post-operative Plan: Extubation in OR  Informed Consent: I have reviewed the patients History and Physical, chart, labs and discussed the procedure including the risks, benefits and alternatives for the proposed anesthesia with the patient or authorized representative who has indicated his/her understanding and acceptance.     Dental advisory given  Plan Discussed with: CRNA  Anesthesia Plan Comments:        Anesthesia Quick Evaluation

## 2019-10-31 NOTE — ED Notes (Signed)
Date and time results received: 10/31/19 2235 (use smartphrase ".now" to insert current time)  Test: Lactic Acid  Critical Value: 2.1  Name of Provider Notified: Lockwood,MD  Orders Received? Or Actions Taken?:

## 2019-11-01 ENCOUNTER — Inpatient Hospital Stay (HOSPITAL_COMMUNITY): Payer: Medicare HMO | Admitting: Anesthesiology

## 2019-11-01 ENCOUNTER — Encounter (HOSPITAL_COMMUNITY): Admission: EM | Disposition: A | Discharge status: Skilled Nursing Facility | Payer: Self-pay | Source: Home / Self Care

## 2019-11-01 ENCOUNTER — Encounter (HOSPITAL_COMMUNITY): Payer: Self-pay

## 2019-11-01 ENCOUNTER — Inpatient Hospital Stay (HOSPITAL_COMMUNITY): Payer: Medicare HMO

## 2019-11-01 DIAGNOSIS — S2249XA Multiple fractures of ribs, unspecified side, initial encounter for closed fracture: Secondary | ICD-10-CM | POA: Diagnosis not present

## 2019-11-01 DIAGNOSIS — S82201A Unspecified fracture of shaft of right tibia, initial encounter for closed fracture: Secondary | ICD-10-CM | POA: Diagnosis not present

## 2019-11-01 DIAGNOSIS — S72142A Displaced intertrochanteric fracture of left femur, initial encounter for closed fracture: Secondary | ICD-10-CM

## 2019-11-01 DIAGNOSIS — S82251A Displaced comminuted fracture of shaft of right tibia, initial encounter for closed fracture: Secondary | ICD-10-CM | POA: Diagnosis not present

## 2019-11-01 DIAGNOSIS — Z9889 Other specified postprocedural states: Secondary | ICD-10-CM | POA: Diagnosis not present

## 2019-11-01 DIAGNOSIS — S72002D Fracture of unspecified part of neck of left femur, subsequent encounter for closed fracture with routine healing: Secondary | ICD-10-CM | POA: Diagnosis not present

## 2019-11-01 DIAGNOSIS — E559 Vitamin D deficiency, unspecified: Secondary | ICD-10-CM | POA: Diagnosis present

## 2019-11-01 DIAGNOSIS — S72092A Other fracture of head and neck of left femur, initial encounter for closed fracture: Secondary | ICD-10-CM | POA: Diagnosis not present

## 2019-11-01 DIAGNOSIS — S82251D Displaced comminuted fracture of shaft of right tibia, subsequent encounter for closed fracture with routine healing: Secondary | ICD-10-CM | POA: Diagnosis not present

## 2019-11-01 DIAGNOSIS — S82291A Other fracture of shaft of right tibia, initial encounter for closed fracture: Secondary | ICD-10-CM | POA: Diagnosis present

## 2019-11-01 DIAGNOSIS — S36892A Contusion of other intra-abdominal organs, initial encounter: Secondary | ICD-10-CM | POA: Diagnosis not present

## 2019-11-01 DIAGNOSIS — I10 Essential (primary) hypertension: Secondary | ICD-10-CM | POA: Diagnosis not present

## 2019-11-01 DIAGNOSIS — M25511 Pain in right shoulder: Secondary | ICD-10-CM | POA: Diagnosis present

## 2019-11-01 DIAGNOSIS — Z20822 Contact with and (suspected) exposure to covid-19: Secondary | ICD-10-CM | POA: Diagnosis not present

## 2019-11-01 DIAGNOSIS — S8291XD Unspecified fracture of right lower leg, subsequent encounter for closed fracture with routine healing: Secondary | ICD-10-CM | POA: Diagnosis not present

## 2019-11-01 DIAGNOSIS — I77810 Thoracic aortic ectasia: Secondary | ICD-10-CM | POA: Diagnosis present

## 2019-11-01 DIAGNOSIS — D72829 Elevated white blood cell count, unspecified: Secondary | ICD-10-CM | POA: Diagnosis not present

## 2019-11-01 DIAGNOSIS — Y92410 Unspecified street and highway as the place of occurrence of the external cause: Secondary | ICD-10-CM | POA: Diagnosis not present

## 2019-11-01 DIAGNOSIS — S60512A Abrasion of left hand, initial encounter: Secondary | ICD-10-CM | POA: Diagnosis present

## 2019-11-01 DIAGNOSIS — S72142D Displaced intertrochanteric fracture of left femur, subsequent encounter for closed fracture with routine healing: Secondary | ICD-10-CM | POA: Diagnosis not present

## 2019-11-01 DIAGNOSIS — S82401A Unspecified fracture of shaft of right fibula, initial encounter for closed fracture: Secondary | ICD-10-CM | POA: Diagnosis not present

## 2019-11-01 DIAGNOSIS — S82451A Displaced comminuted fracture of shaft of right fibula, initial encounter for closed fracture: Secondary | ICD-10-CM | POA: Diagnosis not present

## 2019-11-01 DIAGNOSIS — R609 Edema, unspecified: Secondary | ICD-10-CM | POA: Diagnosis not present

## 2019-11-01 DIAGNOSIS — M7989 Other specified soft tissue disorders: Secondary | ICD-10-CM | POA: Diagnosis not present

## 2019-11-01 DIAGNOSIS — S728X2A Other fracture of left femur, initial encounter for closed fracture: Secondary | ICD-10-CM | POA: Diagnosis not present

## 2019-11-01 DIAGNOSIS — R69 Illness, unspecified: Secondary | ICD-10-CM | POA: Diagnosis not present

## 2019-11-01 DIAGNOSIS — R339 Retention of urine, unspecified: Secondary | ICD-10-CM | POA: Diagnosis not present

## 2019-11-01 DIAGNOSIS — Z041 Encounter for examination and observation following transport accident: Secondary | ICD-10-CM | POA: Diagnosis not present

## 2019-11-01 DIAGNOSIS — S82401D Unspecified fracture of shaft of right fibula, subsequent encounter for closed fracture with routine healing: Secondary | ICD-10-CM | POA: Diagnosis not present

## 2019-11-01 DIAGNOSIS — S0081XA Abrasion of other part of head, initial encounter: Secondary | ICD-10-CM | POA: Diagnosis not present

## 2019-11-01 DIAGNOSIS — D62 Acute posthemorrhagic anemia: Secondary | ICD-10-CM | POA: Diagnosis not present

## 2019-11-01 DIAGNOSIS — M79609 Pain in unspecified limb: Secondary | ICD-10-CM | POA: Diagnosis not present

## 2019-11-01 DIAGNOSIS — S2242XA Multiple fractures of ribs, left side, initial encounter for closed fracture: Secondary | ICD-10-CM | POA: Diagnosis not present

## 2019-11-01 DIAGNOSIS — F419 Anxiety disorder, unspecified: Secondary | ICD-10-CM | POA: Diagnosis present

## 2019-11-01 DIAGNOSIS — E278 Other specified disorders of adrenal gland: Secondary | ICD-10-CM | POA: Diagnosis present

## 2019-11-01 HISTORY — PX: HIP PINNING,CANNULATED: SHX1758

## 2019-11-01 HISTORY — PX: TIBIA IM NAIL INSERTION: SHX2516

## 2019-11-01 LAB — BASIC METABOLIC PANEL
Anion gap: 10 (ref 5–15)
BUN: 13 mg/dL (ref 8–23)
CO2: 24 mmol/L (ref 22–32)
Calcium: 8.5 mg/dL — ABNORMAL LOW (ref 8.9–10.3)
Chloride: 105 mmol/L (ref 98–111)
Creatinine, Ser: 0.94 mg/dL (ref 0.61–1.24)
GFR calc Af Amer: >60 mL/min (ref >60)
GFR calc non Af Amer: >60 mL/min (ref >60)
Glucose, Bld: 147 mg/dL — ABNORMAL HIGH (ref 70–99)
Potassium: 4.1 mmol/L (ref 3.5–5.1)
Sodium: 139 mmol/L (ref 135–145)

## 2019-11-01 LAB — CBC
HCT: 33.2 % — ABNORMAL LOW (ref 39.0–52.0)
Hemoglobin: 10.5 g/dL — ABNORMAL LOW (ref 13.0–17.0)
MCH: 27.8 pg (ref 26.0–34.0)
MCHC: 31.6 g/dL (ref 30.0–36.0)
MCV: 87.8 fL (ref 80.0–100.0)
Platelets: 257 10*3/uL (ref 150–400)
RBC: 3.78 MIL/uL — ABNORMAL LOW (ref 4.22–5.81)
RDW: 12.8 % (ref 11.5–15.5)
WBC: 14.9 10*3/uL — ABNORMAL HIGH (ref 4.0–10.5)
nRBC: 0 % (ref 0.0–0.2)

## 2019-11-01 LAB — ABO/RH: ABO/RH(D): O POS

## 2019-11-01 LAB — VITAMIN D 25 HYDROXY (VIT D DEFICIENCY, FRACTURES): Vit D, 25-Hydroxy: 16.09 ng/mL — ABNORMAL LOW (ref 30–100)

## 2019-11-01 LAB — HIV ANTIBODY (ROUTINE TESTING W REFLEX): HIV Screen 4th Generation wRfx: NONREACTIVE

## 2019-11-01 LAB — RESP PANEL BY RT PCR (RSV, FLU A&B, COVID)
Influenza A by PCR: NEGATIVE
Influenza B by PCR: NEGATIVE
Respiratory Syncytial Virus by PCR: NEGATIVE
SARS Coronavirus 2 by RT PCR: NEGATIVE

## 2019-11-01 LAB — TYPE AND SCREEN
ABO/RH(D): O POS
Antibody Screen: NEGATIVE

## 2019-11-01 LAB — SURGICAL PCR SCREEN
MRSA, PCR: NEGATIVE
Staphylococcus aureus: NEGATIVE

## 2019-11-01 SURGERY — INSERTION, INTRAMEDULLARY ROD, TIBIA
Anesthesia: General | Site: Hip | Laterality: Right

## 2019-11-01 MED ORDER — GABAPENTIN 100 MG PO CAPS
100.0000 mg | ORAL_CAPSULE | Freq: Three times a day (TID) | ORAL | Status: DC
  Administered 2019-11-01 – 2019-11-03 (×8): 100 mg via ORAL
  Filled 2019-11-01 (×8): qty 1

## 2019-11-01 MED ORDER — PAROXETINE HCL 20 MG PO TABS
60.0000 mg | ORAL_TABLET | Freq: Every day | ORAL | Status: DC
  Administered 2019-11-01 – 2019-11-15 (×15): 60 mg via ORAL
  Filled 2019-11-01 (×15): qty 3

## 2019-11-01 MED ORDER — LACTATED RINGERS IV SOLN: INTRAVENOUS | Status: DC | PRN

## 2019-11-01 MED ORDER — CEFAZOLIN SODIUM-DEXTROSE 2-4 GM/100ML-% IV SOLN
INTRAVENOUS | Status: AC
  Filled 2019-11-01: qty 100

## 2019-11-01 MED ORDER — OXYCODONE HCL 5 MG PO TABS
5.0000 mg | ORAL_TABLET | ORAL | Status: DC | PRN
  Administered 2019-11-01 – 2019-11-02 (×2): 10 mg via ORAL
  Filled 2019-11-01 (×2): qty 2

## 2019-11-01 MED ORDER — 0.9 % SODIUM CHLORIDE (POUR BTL) OPTIME
TOPICAL | Status: DC | PRN
  Administered 2019-11-01: 1000 mL

## 2019-11-01 MED ORDER — PHENYLEPHRINE 40 MCG/ML (10ML) SYRINGE FOR IV PUSH (FOR BLOOD PRESSURE SUPPORT)
PREFILLED_SYRINGE | INTRAVENOUS | Status: AC
  Filled 2019-11-01: qty 10

## 2019-11-01 MED ORDER — MIDAZOLAM HCL 2 MG/2ML IJ SOLN
2.0000 mg | Freq: Once | INTRAMUSCULAR | Status: AC
  Administered 2019-11-01: 2 mg via INTRAVENOUS

## 2019-11-01 MED ORDER — PROMETHAZINE HCL 25 MG/ML IJ SOLN: 6.2500 mg | INTRAMUSCULAR | Status: DC | PRN

## 2019-11-01 MED ORDER — LIDOCAINE 2% (20 MG/ML) 5 ML SYRINGE
INTRAMUSCULAR | Status: DC | PRN
  Administered 2019-11-01: 100 mg via INTRAVENOUS

## 2019-11-01 MED ORDER — HYDROMORPHONE HCL 1 MG/ML IJ SOLN
INTRAMUSCULAR | Status: AC
  Filled 2019-11-01: qty 1

## 2019-11-01 MED ORDER — ENOXAPARIN SODIUM 40 MG/0.4ML ~~LOC~~ SOLN: 40.0000 mg | SUBCUTANEOUS | Status: DC

## 2019-11-01 MED ORDER — ONDANSETRON HCL 4 MG/2ML IJ SOLN: 4.0000 mg | Freq: Four times a day (QID) | INTRAMUSCULAR | Status: DC | PRN

## 2019-11-01 MED ORDER — PROPOFOL 10 MG/ML IV BOLUS
INTRAVENOUS | Status: AC
  Filled 2019-11-01: qty 20

## 2019-11-01 MED ORDER — DEXAMETHASONE SODIUM PHOSPHATE 10 MG/ML IJ SOLN
INTRAMUSCULAR | Status: DC | PRN
  Administered 2019-11-01: 10 mg via INTRAVENOUS

## 2019-11-01 MED ORDER — EPHEDRINE SULFATE-NACL 50-0.9 MG/10ML-% IV SOSY
PREFILLED_SYRINGE | INTRAVENOUS | Status: DC | PRN
  Administered 2019-11-01: 10 mg via INTRAVENOUS

## 2019-11-01 MED ORDER — METHOCARBAMOL 500 MG PO TABS
500.0000 mg | ORAL_TABLET | Freq: Four times a day (QID) | ORAL | Status: DC
  Administered 2019-11-01 (×2): 500 mg via ORAL
  Filled 2019-11-01 (×2): qty 1

## 2019-11-01 MED ORDER — HYDROMORPHONE HCL 1 MG/ML IJ SOLN
INTRAMUSCULAR | Status: AC
  Filled 2019-11-01: qty 2

## 2019-11-01 MED ORDER — HYDROMORPHONE HCL 1 MG/ML IJ SOLN
INTRAMUSCULAR | Status: AC
Start: 2019-11-01 — End: 2019-11-01
  Filled 2019-11-01: qty 1

## 2019-11-01 MED ORDER — HYDROMORPHONE HCL 1 MG/ML IJ SOLN: 0.2500 mg | INTRAMUSCULAR | Status: DC | PRN

## 2019-11-01 MED ORDER — ACETAMINOPHEN 500 MG PO TABS
1000.0000 mg | ORAL_TABLET | Freq: Three times a day (TID) | ORAL | Status: DC
  Administered 2019-11-01 – 2019-11-15 (×43): 1000 mg via ORAL
  Filled 2019-11-01 (×43): qty 2

## 2019-11-01 MED ORDER — MIDAZOLAM HCL 2 MG/2ML IJ SOLN
INTRAMUSCULAR | Status: AC
  Filled 2019-11-01: qty 2

## 2019-11-01 MED ORDER — POLYETHYLENE GLYCOL 3350 17 G PO PACK: 17.0000 g | PACK | Freq: Every day | ORAL | Status: DC

## 2019-11-01 MED ORDER — VANCOMYCIN HCL 1000 MG IV SOLR
INTRAVENOUS | Status: DC | PRN
  Administered 2019-11-01: 1000 mg
  Administered 2019-11-01: 1000 mg via TOPICAL

## 2019-11-01 MED ORDER — METOPROLOL TARTRATE 5 MG/5ML IV SOLN: 5.0000 mg | Freq: Three times a day (TID) | INTRAVENOUS | Status: DC | PRN

## 2019-11-01 MED ORDER — DEXAMETHASONE SODIUM PHOSPHATE 10 MG/ML IJ SOLN
INTRAMUSCULAR | Status: AC
  Filled 2019-11-01: qty 1

## 2019-11-01 MED ORDER — ACETAMINOPHEN 10 MG/ML IV SOLN
INTRAVENOUS | Status: AC
  Filled 2019-11-01: qty 100

## 2019-11-01 MED ORDER — MIDAZOLAM HCL 5 MG/5ML IJ SOLN
INTRAMUSCULAR | Status: DC | PRN
  Administered 2019-11-01: 2 mg via INTRAVENOUS

## 2019-11-01 MED ORDER — FENTANYL CITRATE (PF) 250 MCG/5ML IJ SOLN
INTRAMUSCULAR | Status: AC
  Filled 2019-11-01: qty 5

## 2019-11-01 MED ORDER — CHLORHEXIDINE GLUCONATE CLOTH 2 % EX PADS
6.0000 | MEDICATED_PAD | Freq: Every day | CUTANEOUS | Status: DC
  Administered 2019-11-02 – 2019-11-15 (×9): 6 via TOPICAL

## 2019-11-01 MED ORDER — SUGAMMADEX SODIUM 200 MG/2ML IV SOLN
INTRAVENOUS | Status: DC | PRN
  Administered 2019-11-01: 200 mg via INTRAVENOUS

## 2019-11-01 MED ORDER — ROCURONIUM BROMIDE 10 MG/ML (PF) SYRINGE
PREFILLED_SYRINGE | INTRAVENOUS | Status: DC | PRN
  Administered 2019-11-01: 60 mg via INTRAVENOUS
  Administered 2019-11-01: 40 mg via INTRAVENOUS

## 2019-11-01 MED ORDER — EPHEDRINE 5 MG/ML INJ
INTRAVENOUS | Status: AC
  Filled 2019-11-01: qty 10

## 2019-11-01 MED ORDER — ACETAMINOPHEN 10 MG/ML IV SOLN
1000.0000 mg | Freq: Once | INTRAVENOUS | Status: AC
  Administered 2019-11-01: 1000 mg via INTRAVENOUS

## 2019-11-01 MED ORDER — LACTATED RINGERS IV SOLN: INTRAVENOUS | Status: DC

## 2019-11-01 MED ORDER — VANCOMYCIN HCL 1000 MG IV SOLR
INTRAVENOUS | Status: AC
  Filled 2019-11-01: qty 2000

## 2019-11-01 MED ORDER — ONDANSETRON 4 MG PO TBDP: 4.0000 mg | ORAL_TABLET | Freq: Four times a day (QID) | ORAL | Status: DC | PRN

## 2019-11-01 MED ORDER — ENOXAPARIN SODIUM 30 MG/0.3ML ~~LOC~~ SOLN: 30.0000 mg | Freq: Two times a day (BID) | SUBCUTANEOUS | Status: DC

## 2019-11-01 MED ORDER — CEFAZOLIN SODIUM-DEXTROSE 2-3 GM-%(50ML) IV SOLR
INTRAVENOUS | Status: DC | PRN
  Administered 2019-11-01: 2 g via INTRAVENOUS

## 2019-11-01 MED ORDER — CEFAZOLIN SODIUM-DEXTROSE 2-4 GM/100ML-% IV SOLN
2.0000 g | Freq: Three times a day (TID) | INTRAVENOUS | Status: AC
  Administered 2019-11-01 – 2019-11-02 (×3): 2 g via INTRAVENOUS
  Filled 2019-11-01 (×3): qty 100

## 2019-11-01 MED ORDER — HYDROMORPHONE HCL 1 MG/ML IJ SOLN
0.2500 mg | INTRAMUSCULAR | Status: DC | PRN
  Administered 2019-11-01 (×4): 0.5 mg via INTRAVENOUS
  Administered 2019-11-01: 1 mg via INTRAVENOUS

## 2019-11-01 MED ORDER — HYDROMORPHONE HCL 1 MG/ML IJ SOLN
0.5000 mg | INTRAMUSCULAR | Status: DC | PRN
  Administered 2019-11-01 – 2019-11-04 (×6): 1 mg via INTRAVENOUS
  Filled 2019-11-01 (×6): qty 1

## 2019-11-01 MED ORDER — DOCUSATE SODIUM 100 MG PO CAPS
100.0000 mg | ORAL_CAPSULE | Freq: Two times a day (BID) | ORAL | Status: DC
  Administered 2019-11-01 – 2019-11-15 (×18): 100 mg via ORAL
  Filled 2019-11-01 (×25): qty 1

## 2019-11-01 MED ORDER — BACITRACIN ZINC 500 UNIT/GM EX OINT
TOPICAL_OINTMENT | Freq: Two times a day (BID) | CUTANEOUS | Status: DC
  Administered 2019-11-03 – 2019-11-12 (×2): 1 via TOPICAL
  Filled 2019-11-01 (×8): qty 28.35

## 2019-11-01 MED ORDER — PROPOFOL 10 MG/ML IV BOLUS
INTRAVENOUS | Status: DC | PRN
  Administered 2019-11-01: 160 mg via INTRAVENOUS

## 2019-11-01 MED ORDER — ONDANSETRON HCL 4 MG/2ML IJ SOLN
INTRAMUSCULAR | Status: AC
  Filled 2019-11-01: qty 2

## 2019-11-01 MED ORDER — ONDANSETRON HCL 4 MG/2ML IJ SOLN
INTRAMUSCULAR | Status: DC | PRN
  Administered 2019-11-01: 4 mg via INTRAVENOUS

## 2019-11-01 MED ORDER — HYDROMORPHONE HCL 1 MG/ML IJ SOLN
0.5000 mg | INTRAMUSCULAR | Status: DC | PRN
  Administered 2019-11-01 (×2): 0.5 mg via INTRAVENOUS
  Filled 2019-11-01 (×2): qty 1

## 2019-11-01 MED ORDER — MEPERIDINE HCL 25 MG/ML IJ SOLN: 6.2500 mg | INTRAMUSCULAR | Status: DC | PRN

## 2019-11-01 MED ORDER — FENTANYL CITRATE (PF) 250 MCG/5ML IJ SOLN
INTRAMUSCULAR | Status: DC | PRN
Start: 2019-11-01 — End: 2019-11-01
  Administered 2019-11-01 (×2): 50 ug via INTRAVENOUS
  Administered 2019-11-01: 25 ug via INTRAVENOUS
  Administered 2019-11-01: 50 ug via INTRAVENOUS
  Administered 2019-11-01: 25 ug via INTRAVENOUS
  Administered 2019-11-01 (×5): 50 ug via INTRAVENOUS

## 2019-11-01 MED ORDER — PHENYLEPHRINE 40 MCG/ML (10ML) SYRINGE FOR IV PUSH (FOR BLOOD PRESSURE SUPPORT)
PREFILLED_SYRINGE | INTRAVENOUS | Status: DC | PRN
  Administered 2019-11-01 (×2): 80 ug via INTRAVENOUS
  Administered 2019-11-01: 40 ug via INTRAVENOUS
  Administered 2019-11-01: 80 ug via INTRAVENOUS

## 2019-11-01 SURGICAL SUPPLY — 75 items
APL PRP STRL LF DISP 70% ISPRP (MISCELLANEOUS) ×2
BIT DRILL INTERTAN LAG SCREW (BIT) ×1 IMPLANT
BIT DRILL LONG 4.0 (BIT) IMPLANT
BIT DRILL SHORT 4.0 (BIT) IMPLANT
BLADE SURG 10 STRL SS (BLADE) ×6 IMPLANT
BNDG CMPR MED 10X6 ELC LF (GAUZE/BANDAGES/DRESSINGS) ×2
BNDG CMPR STD VLCR NS LF 5.8X4 (GAUZE/BANDAGES/DRESSINGS) ×2
BNDG COHESIVE 4X5 TAN STRL (GAUZE/BANDAGES/DRESSINGS) ×3 IMPLANT
BNDG COHESIVE 6X5 TAN STRL LF (GAUZE/BANDAGES/DRESSINGS) ×3 IMPLANT
BNDG ELASTIC 4X5.8 VLCR NS LF (GAUZE/BANDAGES/DRESSINGS) ×1 IMPLANT
BNDG ELASTIC 4X5.8 VLCR STR LF (GAUZE/BANDAGES/DRESSINGS) ×3 IMPLANT
BNDG ELASTIC 6X10 VLCR STRL LF (GAUZE/BANDAGES/DRESSINGS) ×1 IMPLANT
BNDG ELASTIC 6X5.8 VLCR STR LF (GAUZE/BANDAGES/DRESSINGS) ×3 IMPLANT
BRUSH SCRUB EZ PLAIN DRY (MISCELLANEOUS) ×6 IMPLANT
CHLORAPREP W/TINT 26 (MISCELLANEOUS) ×3 IMPLANT
COVER SURGICAL LIGHT HANDLE (MISCELLANEOUS) ×5 IMPLANT
DRAPE C-ARM 42X72 X-RAY (DRAPES) ×3 IMPLANT
DRAPE C-ARMOR (DRAPES) ×3 IMPLANT
DRAPE HALF SHEET 40X57 (DRAPES) ×6 IMPLANT
DRAPE IMP U-DRAPE 54X76 (DRAPES) ×6 IMPLANT
DRAPE INCISE IOBAN 66X45 STRL (DRAPES) ×1 IMPLANT
DRAPE ORTHO SPLIT 77X108 STRL (DRAPES) ×9
DRAPE STERI IOBAN 125X83 (DRAPES) ×3 IMPLANT
DRAPE SURG 17X23 STRL (DRAPES) ×3 IMPLANT
DRAPE SURG ORHT 6 SPLT 77X108 (DRAPES) ×4 IMPLANT
DRAPE U-SHAPE 47X51 STRL (DRAPES) ×3 IMPLANT
DRILL BIT LONG 4.0 (BIT) ×6
DRILL BIT SHORT 4.0 (BIT) ×6
DRSG MEPILEX BORDER 4X4 (GAUZE/BANDAGES/DRESSINGS) ×3 IMPLANT
DRSG MEPILEX BORDER 4X8 (GAUZE/BANDAGES/DRESSINGS) ×1 IMPLANT
GAUZE SPONGE 4X4 12PLY STRL (GAUZE/BANDAGES/DRESSINGS) ×1 IMPLANT
GLOVE BIO SURGEON STRL SZ 6.5 (GLOVE) ×9 IMPLANT
GLOVE BIO SURGEON STRL SZ7.5 (GLOVE) ×12 IMPLANT
GLOVE BIOGEL PI IND STRL 6.5 (GLOVE) ×2 IMPLANT
GLOVE BIOGEL PI IND STRL 7.5 (GLOVE) ×2 IMPLANT
GLOVE BIOGEL PI INDICATOR 6.5 (GLOVE) ×1
GLOVE BIOGEL PI INDICATOR 7.5 (GLOVE) ×1
GOWN STRL REUS W/ TWL LRG LVL3 (GOWN DISPOSABLE) ×4 IMPLANT
GOWN STRL REUS W/TWL LRG LVL3 (GOWN DISPOSABLE) ×6
GUIDE PIN 3.2X343 (PIN) ×10
GUIDE PIN 3.2X343MM (PIN) ×15
GUIDE ROD 3.0 (MISCELLANEOUS) ×3
KIT BASIN OR (CUSTOM PROCEDURE TRAY) ×3 IMPLANT
KIT TURNOVER KIT B (KITS) ×3 IMPLANT
MANIFOLD NEPTUNE II (INSTRUMENTS) ×3 IMPLANT
NAIL INTERTAN 10X18 130D 10S (Nail) ×1 IMPLANT
NAIL TIBIAL 10X38 (Nail) ×1 IMPLANT
NS IRRIG 1000ML POUR BTL (IV SOLUTION) ×3 IMPLANT
PACK GENERAL/GYN (CUSTOM PROCEDURE TRAY) ×3 IMPLANT
PACK TOTAL JOINT (CUSTOM PROCEDURE TRAY) ×3 IMPLANT
PADDING CAST SYN 6 (CAST SUPPLIES) ×1
PADDING CAST SYNTHETIC 4 (CAST SUPPLIES) ×1
PADDING CAST SYNTHETIC 4X4 STR (CAST SUPPLIES) IMPLANT
PADDING CAST SYNTHETIC 6X4 NS (CAST SUPPLIES) IMPLANT
PIN GUIDE 3.2X343MM (PIN) IMPLANT
ROD GUIDE 3.0 (MISCELLANEOUS) IMPLANT
SCREW LAG COMPR KIT 105/100 (Screw) ×1 IMPLANT
SCREW TRIGEN LOW PROF 5.0X32.5 (Screw) ×1 IMPLANT
SCREW TRIGEN LOW PROF 5.0X37.5 (Screw) ×1 IMPLANT
SCREW TRIGEN LOW PROF 5.0X40 (Screw) ×1 IMPLANT
SCREW TRIGEN LOW PROF 5.0X47.5 (Screw) ×1 IMPLANT
SCREW TRIGEN LOW PROF 5.0X70 (Screw) ×1 IMPLANT
STAPLER VISISTAT 35W (STAPLE) ×3 IMPLANT
STOCKINETTE IMPERVIOUS LG (DRAPES) ×3 IMPLANT
SUT MNCRL AB 3-0 PS2 27 (SUTURE) ×1 IMPLANT
SUT MNCRL+ AB 3-0 CT1 36 (SUTURE) IMPLANT
SUT MONOCRYL AB 3-0 CT1 36IN (SUTURE) ×3
SUT VIC AB 0 CT1 27 (SUTURE) ×3
SUT VIC AB 0 CT1 27XBRD ANBCTR (SUTURE) IMPLANT
SUT VIC AB 2-0 CT1 27 (SUTURE) ×6
SUT VIC AB 2-0 CT1 TAPERPNT 27 (SUTURE) IMPLANT
TOWEL GREEN STERILE (TOWEL DISPOSABLE) ×6 IMPLANT
TOWEL GREEN STERILE FF (TOWEL DISPOSABLE) ×3 IMPLANT
TRAY FOLEY W/BAG SLVR 14FR (SET/KITS/TRAYS/PACK) ×1 IMPLANT
YANKAUER SUCT BULB TIP NO VENT (SUCTIONS) IMPLANT
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
## 2019-11-01 NOTE — Progress Notes (Signed)
Finalized reports on CT scans reviewed. No acute C spine fracture. Patient examined at bedside and had already removed the C collar himself. He has no cervical spinal tenderness to palpation, and full ROM of the neck is in tact with no pain, numbness or paresthesias. C collar clinically cleared.

## 2019-11-01 NOTE — Anesthesia Procedure Notes (Signed)
Procedure Name: Intubation Date/Time: 11/01/2019 8:04 AM Performed by: Kyung Rudd, CRNA Pre-anesthesia Checklist: Patient identified, Emergency Drugs available, Suction available and Patient being monitored Patient Re-evaluated:Patient Re-evaluated prior to induction Oxygen Delivery Method: Circle system utilized Preoxygenation: Pre-oxygenation with 100% oxygen Induction Type: IV induction Ventilation: Mask ventilation without difficulty and Oral airway inserted - appropriate to patient size Laryngoscope Size: Mac and 4 Grade View: Grade I Tube type: Oral Tube size: 7.5 mm Number of attempts: 1 Airway Equipment and Method: Stylet Placement Confirmation: ETT inserted through vocal cords under direct vision,  positive ETCO2 and breath sounds checked- equal and bilateral Secured at: 21 cm Tube secured with: Tape Dental Injury: Teeth and Oropharynx as per pre-operative assessment
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
## 2019-11-01 NOTE — Transfer of Care (Signed)
Immediate Anesthesia Transfer of Care Note  Patient: Patrick Cruz  Procedure(s) Performed: INTRAMEDULLARY (IM) NAIL TIBIAL (Right ) ORIF PROXIMAL FEMORAL NECK FRACTURE (Left Hip)  Patient Location: PACU  Anesthesia Type:General  Level of Consciousness: awake, alert  and oriented  Airway & Oxygen Therapy: Patient Spontanous Breathing  Post-op Assessment: Report given to RN and Post -op Vital signs reviewed and stable  Post vital signs: Reviewed and stable  Last Vitals:  Vitals Value Taken Time  BP 121/61 11/01/19 1117  Temp    Pulse 67 11/01/19 1118  Resp 15 11/01/19 1118  SpO2 96 % 11/01/19 1118  Vitals shown include unvalidated device data.  Last Pain:  Vitals:   11/01/19 0429  TempSrc: Oral  PainSc:          Complications: No complications documented.
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
## 2019-11-01 NOTE — ED Notes (Signed)
Call 6N awaiting return call from RN to give pt report.

## 2019-11-01 NOTE — ED Notes (Addendum)
Ortho tech assessed splint and adjusted appropriately. Cap refill < 2

## 2019-11-01 NOTE — Progress Notes (Signed)
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 convincing appearance on the delayed phase imaging. Several prominent lymph nodes are also present in this vicinity, nonspecific. No other acute vascular injury seen in the abdomen or pelvis. Atherosclerotic calcifications within the abdominal aorta and branch vessels. No aneurysm or ectasia. Reproductive: The prostate and seminal vesicles are unremarkable. Other: No abdominopelvic free air or fluid. No bowel containing hernia. No traumatic abdominal wall dehiscence. Mild soft tissue swelling about the left hip with fracture as detailed below. No large body wall hematoma or  retroperitoneal hematoma. Musculoskeletal: Mildly comminuted intertrochanteric left femur fracture with small amount of surrounding hemorrhage and intramuscular thickening in stranding about the fracture site. No active contrast extravasation. Displaced fracture fragments of both the greater and lesser trochanters are noted. No other acute fracture of the bony pelvis is seen. Femoral heads are normally located. Levocurvature of the lumbar spine with an apex at the L3 level. Large Schmorl's node formations are present in the superior endplates L2, L3 with some anterior wedging at L2 appearing likely remote. Additional Modic type and discogenic endplate changes are present at the remaining lumbar levels. There is grade 1 anterolisthesis L5 on S1 with corticated bilateral L5 pars defects. 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 Mild degenerative change of the hips. No acute osseous abnormality of the included portions of the left upper extremity. IMPRESSION: CT head: 1. Multiple sites of scalp contusion.  No calvarial fracture. 2. No acute intracranial abnormality. 3. Chronic parenchymal volume loss and chronic microvascular angiopathy changes. CT cervical: 1. No acute fracture or traumatic listhesis of the cervical spine. 2. Multilevel mild spondylitic and facet hypertrophic changes, maximal C5-6. 3. Mild soft tissue stranding  superficial to the posterior cervical musculature, could reflect edematous change or mild muscle strain. CT chest, abdomen and pelvis. 1. Questionable stranding in the right upper quadrant 2. Nondisplaced fractures of the left posterior first through third ribs. Minimally displaced fractures along the anterolateral left third through tenth ribs. Possible nondisplaced fractures of the right posterior first and second ribs as well though there is some motion artifact in this vicinity. No pneumothorax or effusion. Small amount posterior ground-glass in the left lung base,  favor atelectatic some contusive change could be present. 3. Mildly comminuted intertrochanteric left femur fracture with small amount of surrounding hemorrhage and intramuscular thickening in stranding about the fracture site. No active contrast extravasation. 4. Some focal mesenteric stranding is seen in the right upper quadrant with several loops of adjacent small bowel possibly demonstrating some slightly decreased bowel wall enhancement though this is difficult to fully assess given the extent of motion artifact in the vicinity. Additionally, several prominent lymph nodes are also seen in this location. Could reflect some mesenteric contusion without active hemorrhage. Consider evaluation with serial abdominal exams and possible outpatient follow-up imaging as clinically warranted. 5. Mild anterior wedging at L2 is favored to be chronic with superimposed Schmorl's node formations. Correlate for point tenderness. 6. Low-attenuation left adrenal nodule measuring approximately 4.6 x 3.4 cm in size, low-attenuation favors benign adrenal adenoma though should consider surgical consultation based on the size of this lesion. Unlikely to reflect an adrenal hemorrhage. This recommendation follows ACR consensus guidelines: Management of Incidental Adrenal Masses: A White Paper of the ACR Incidental Findings Committee. J Am Coll Radiol 2017;14:1038-1044. 7. Aortic Atherosclerosis (ICD10-I70.0). These results were called by telephone at the time of interpretation on 10/31/2019 at 11:49 pm to provider Carmin Muskrat , who verbally acknowledged these results. Electronically Signed: By: Lovena Le M.D. On: 10/31/2019 23:51   DG Pelvis Portable  Result Date: 10/31/2019 CLINICAL DATA:  Status post trauma. EXAM: PORTABLE PELVIS 1-2 VIEWS COMPARISON:  None. FINDINGS: Acute inter trochanteric fracture of the proximal left femur is seen. There is no evidence of dislocation. No pelvic bone lesions are seen. IMPRESSION: Acute  intertrochanteric fracture of the proximal left femur. Electronically Signed   By: Virgina Norfolk M.D.   On: 10/31/2019 21:31   DG Chest Port 1 View  Result Date: 10/31/2019 CLINICAL DATA:  Status post trauma. EXAM: PORTABLE CHEST 1 VIEW COMPARISON:  Jun 15, 2018 FINDINGS: Mild, diffuse, chronic appearing increased interstitial lung markings are seen. There is no evidence of acute infiltrate, pleural effusion or pneumothorax. The heart size and mediastinal contours are within normal limits. There is mild calcification of the aortic arch. The visualized skeletal structures are unremarkable. IMPRESSION: Chronic appearing increased interstitial lung markings without acute or active cardiopulmonary disease. Electronically Signed   By: Virgina Norfolk M.D.   On: 10/31/2019 21:35   DG Tibia/Fibula Right Port  Result Date: 11/01/2019 CLINICAL DATA:  Postop fracture fixation EXAM: PORTABLE RIGHT TIBIA AND FIBULA - 2 VIEW COMPARISON:  10/31/2019 FINDINGS: Comminuted fracture mid tibia has been reduced. Locking intramedullary rod in good position. Satisfactory fracture alignment Fracture mid fibula without significant displacement is unchanged. IMPRESSION: Satisfactory ORIF mid tibial fracture. Mid fibular fracture in satisfactory alignment. Electronically Signed   By: Franchot Gallo M.D.   On: 11/01/2019 13:41   DG Tibia/Fibula Right Port  Result Date: 10/31/2019 CLINICAL DATA:  Status post trauma. EXAM: PORTABLE RIGHT TIBIA AND FIBULA - 2 VIEW COMPARISON:  None. FINDINGS: Acute fracture is seen involving  the mid right tibial shaft. An additional comminuted fracture of the mid shaft of the right fibula is seen. There is no evidence of dislocation. Moderate severity diffuse soft tissue swelling is noted. IMPRESSION: Acute fractures of the mid shafts of the right tibia and right fibula. Electronically Signed   By: Virgina Norfolk M.D.   On: 10/31/2019 21:37   DG C-Arm 1-60 Min  Result Date:  11/01/2019 CLINICAL DATA:  Rod and screw fixation for basicervical femoral neck fracture EXAM: OPERATIVE LEFT HIP  2 VIEWS TECHNIQUE: Fluoroscopic spot image(s) were submitted for interpretation post-operatively. FLUOROSCOPY TIME:  5 minutes 24 seconds; 65.30 mGy; 8 acquired images COMPARISON:  October 31, 2019 FINDINGS: Initial images showed basicervical left femoral neck fracture with mildly displaced fracture fragments. Subsequent images show screw and rod fixation through this area with alignment near anatomic after surgical fixation. Screw tips in proximal femur. No other fractures. No dislocation. Slight narrowing left hip joint. IMPRESSION: Screw and rod fixation through basicervical femoral neck fracture on the left with alignment near anatomic after surgical fixation. Screw tips in proximal femoral head. No new fracture. No dislocation. Mild narrowing left hip joint. Electronically Signed   By: Lowella Grip III M.D.   On: 11/01/2019 11:16   DG HIP PORT UNILAT W OR W/O PELVIS 1V LEFT  Result Date: 11/01/2019 CLINICAL DATA:  Postop left hip. EXAM: DG HIP (WITH OR WITHOUT PELVIS) 1V PORT LEFT COMPARISON:  11/01/2019. FINDINGS: ORIF left hip. Hardware intact. Anatomic alignment. No acute bony abnormality identified. IMPRESSION: ORIF left hip with anatomic alignment. Electronically Signed   By: Marcello Moores  Register   On: 11/01/2019 13:32   DG HIP OPERATIVE UNILAT WITH PELVIS LEFT  Result Date: 11/01/2019 CLINICAL DATA:  Rod and screw fixation for basicervical femoral neck fracture EXAM: OPERATIVE LEFT HIP  2 VIEWS TECHNIQUE: Fluoroscopic spot image(s) were submitted for interpretation post-operatively. FLUOROSCOPY TIME:  5 minutes 24 seconds; 65.30 mGy; 8 acquired images COMPARISON:  October 31, 2019 FINDINGS: Initial images showed basicervical left femoral neck fracture with mildly displaced fracture fragments. Subsequent images show screw and rod fixation through this area with alignment near  anatomic after surgical fixation. Screw tips in proximal femur. No other fractures. No dislocation. Slight narrowing left hip joint. IMPRESSION: Screw and rod fixation through basicervical femoral neck fracture on the left with alignment near anatomic after surgical fixation. Screw tips in proximal femoral head. No new fracture. No dislocation. Mild narrowing left hip joint. Electronically Signed   By: Lowella Grip III M.D.   On: 11/01/2019 11:16    Anti-infectives: Anti-infectives (From admission, onward)   Start     Dose/Rate Route Frequency Ordered Stop   11/01/19 0945  vancomycin (VANCOCIN) powder  Status:  Discontinued          As needed 11/01/19 1006 11/01/19 1112   11/01/19 0748  ceFAZolin (ANCEF) 2-4 GM/100ML-% IVPB       Note to Pharmacy: Tamsen Snider   : cabinet override      11/01/19 0748 11/01/19 1959       Assessment/Plan [facility]  R tib-fib fx - s/p IMN by Dr. Doreatha Martin 9/30. WB for transfers only. PT/OT L femoral intertrochanteric fx - S/p IMN by Dr. Doreatha Martin 9/30. WB for transfers only. PT/OT L rib fractures 1-10 - Multimodal pain control. Pulm toilet. Wean o2 as able Possible mesenteric contusion - abd exam benign  Possible impact fx of R greater tuberosity - Await final ortho recs Road rash - local wound care  FEN - Reg VTE - SCDs, Lovenox ID - Ancef periop for Ortho procedures  Foley - In place given decreased mobility w/ femoral fx Dispo - PT/OT. Lives at home with his wife.    LOS: 0 days    Jillyn Ledger , [facility] Surgery 11/01/2019, 2:57 PM Please see Amion for pager number during day hours 7:00am-4:30pm

## 2019-11-02 ENCOUNTER — Encounter (HOSPITAL_COMMUNITY): Payer: Self-pay | Admitting: Student

## 2019-11-02 LAB — BASIC METABOLIC PANEL
Anion gap: 8 (ref 5–15)
BUN: 15 mg/dL (ref 8–23)
CO2: 26 mmol/L (ref 22–32)
Calcium: 8 mg/dL — ABNORMAL LOW (ref 8.9–10.3)
Chloride: 103 mmol/L (ref 98–111)
Creatinine, Ser: 1.1 mg/dL (ref 0.61–1.24)
GFR calc Af Amer: >60 mL/min (ref >60)
GFR calc non Af Amer: >60 mL/min (ref >60)
Glucose, Bld: 148 mg/dL — ABNORMAL HIGH (ref 70–99)
Potassium: 4 mmol/L (ref 3.5–5.1)
Sodium: 137 mmol/L (ref 135–145)

## 2019-11-02 LAB — CBC
HCT: 24.9 % — ABNORMAL LOW (ref 39.0–52.0)
Hemoglobin: 7.7 g/dL — ABNORMAL LOW (ref 13.0–17.0)
MCH: 28.3 pg (ref 26.0–34.0)
MCHC: 30.9 g/dL (ref 30.0–36.0)
MCV: 91.5 fL (ref 80.0–100.0)
Platelets: 215 10*3/uL (ref 150–400)
RBC: 2.72 MIL/uL — ABNORMAL LOW (ref 4.22–5.81)
RDW: 13.4 % (ref 11.5–15.5)
WBC: 11.4 10*3/uL — ABNORMAL HIGH (ref 4.0–10.5)
nRBC: 0 % (ref 0.0–0.2)

## 2019-11-02 MED ORDER — ENOXAPARIN SODIUM 30 MG/0.3ML ~~LOC~~ SOLN
30.0000 mg | Freq: Two times a day (BID) | SUBCUTANEOUS | Status: DC
  Administered 2019-11-02 – 2019-11-15 (×26): 30 mg via SUBCUTANEOUS
  Filled 2019-11-02 (×26): qty 0.3

## 2019-11-02 MED ORDER — OXYCODONE HCL 5 MG PO TABS
10.0000 mg | ORAL_TABLET | ORAL | Status: DC | PRN
  Administered 2019-11-02 – 2019-11-03 (×2): 10 mg via ORAL
  Administered 2019-11-03 – 2019-11-05 (×13): 15 mg via ORAL
  Administered 2019-11-06 (×2): 10 mg via ORAL
  Administered 2019-11-06 – 2019-11-07 (×5): 15 mg via ORAL
  Administered 2019-11-07 – 2019-11-08 (×2): 10 mg via ORAL
  Administered 2019-11-08 – 2019-11-14 (×29): 15 mg via ORAL
  Administered 2019-11-14: 10 mg via ORAL
  Administered 2019-11-14 – 2019-11-15 (×3): 15 mg via ORAL
  Administered 2019-11-15: 10 mg via ORAL
  Administered 2019-11-15 (×2): 15 mg via ORAL
  Filled 2019-11-02 (×22): qty 3
  Filled 2019-11-02: qty 2
  Filled 2019-11-02: qty 3
  Filled 2019-11-02: qty 2
  Filled 2019-11-02 (×2): qty 3
  Filled 2019-11-02: qty 2
  Filled 2019-11-02 (×3): qty 3
  Filled 2019-11-02: qty 2
  Filled 2019-11-02 (×4): qty 3
  Filled 2019-11-02: qty 2
  Filled 2019-11-02 (×16): qty 3
  Filled 2019-11-02: qty 2
  Filled 2019-11-02 (×5): qty 3
  Filled 2019-11-02: qty 2
  Filled 2019-11-02 (×2): qty 3
  Filled 2019-11-02: qty 2

## 2019-11-02 MED ORDER — KETOROLAC TROMETHAMINE 15 MG/ML IJ SOLN
15.0000 mg | Freq: Four times a day (QID) | INTRAMUSCULAR | Status: AC
  Administered 2019-11-02 – 2019-11-03 (×3): 15 mg via INTRAVENOUS
  Filled 2019-11-02 (×5): qty 1

## 2019-11-02 MED ORDER — CLONAZEPAM 1 MG PO TABS
1.0000 mg | ORAL_TABLET | Freq: Three times a day (TID) | ORAL | Status: DC | PRN
  Administered 2019-11-02 – 2019-11-06 (×14): 1 mg via ORAL
  Filled 2019-11-02 (×14): qty 1

## 2019-11-02 MED ORDER — VITAMIN D 25 MCG (1000 UNIT) PO TABS
2000.0000 [IU] | ORAL_TABLET | Freq: Two times a day (BID) | ORAL | Status: DC
  Administered 2019-11-02 – 2019-11-15 (×28): 2000 [IU] via ORAL
  Filled 2019-11-02 (×28): qty 2

## 2019-11-02 MED ORDER — BETHANECHOL CHLORIDE 5 MG PO TABS
5.0000 mg | ORAL_TABLET | Freq: Three times a day (TID) | ORAL | Status: DC
  Administered 2019-11-02 – 2019-11-15 (×39): 5 mg via ORAL
  Filled 2019-11-02 (×43): qty 1

## 2019-11-02 MED ORDER — METHOCARBAMOL 500 MG PO TABS
1000.0000 mg | ORAL_TABLET | Freq: Three times a day (TID) | ORAL | Status: DC
  Administered 2019-11-02 – 2019-11-15 (×41): 1000 mg via ORAL
  Filled 2019-11-02 (×41): qty 2

## 2019-11-02 MED ORDER — POLYETHYLENE GLYCOL 3350 17 G PO PACK
17.0000 g | PACK | Freq: Two times a day (BID) | ORAL | Status: DC
  Administered 2019-11-02 – 2019-11-15 (×13): 17 g via ORAL
  Filled 2019-11-02 (×24): qty 1

## 2019-11-02 NOTE — Plan of Care (Signed)
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
## 2019-11-02 NOTE — Progress Notes (Signed)
Follow up CBC this afternoon w/ hgb stable from 7.9 -> 7.7. Will start prophylactic Lovenox.   Alferd Apa, [facility] Surgery 11/02/2019, 2:30 PM

## 2019-11-02 NOTE — Anesthesia Postprocedure Evaluation (Signed)
Anesthesia Post Note  Patient: Patrick Cruz  Procedure(s) Performed: INTRAMEDULLARY (IM) NAIL TIBIAL (Right ) ORIF PROXIMAL FEMORAL NECK FRACTURE (Left Hip)     Patient location during evaluation: PACU Anesthesia Type: General Level of consciousness: sedated and patient cooperative Pain management: pain level controlled Vital Signs Assessment: post-procedure vital signs reviewed and stable Respiratory status: spontaneous breathing Cardiovascular status: stable Anesthetic complications: no   No complications documented.  Last Vitals:  Vitals:   11/02/19 0243 11/02/19 0549  BP: 127/67 (!) 159/75  Pulse: 84 75  Resp: 17 16  Temp: (!) 36.3 C 36.7 C  SpO2: 98% 99%    Last Pain:  Vitals:   11/02/19 0549  TempSrc: Oral  PainSc:                  Patrick Cruz

## 2019-11-02 NOTE — TOC CAGE-AID Note (Signed)
Transition of Care [facility]) - CAGE-AID Screening   Patient Details  Name: Patrick Cruz MRN: [id number] Date of Birth: [birth year]  [facility] CM/SW Contact:    Emeterio Reeve, [place] Phone Number: 11/02/2019, 4:28 PM   Clinical Narrative:  CSW met with pt via phone. CSW introduced self and explained her role at the hospital.  PT denies alcohol use and substance use. Pt did not need any resources at this time.    CAGE-AID Screening:    Have You Ever Felt You Ought to Cut Down on Your Drinking or Drug Use?: No Have People Annoyed You By Critizing Your Drinking Or Drug Use?: No Have You Felt Bad Or Guilty About Your Drinking Or Drug Use?: No Have You Ever Had a Drink or Used Drugs First Thing In The Morning to Steady Your Nerves or to Get Rid of a Hangover?: No CAGE-AID Score: 0  Substance Abuse Education Offered: Yes     Blima Ledger, [place] Social Worker [phone]
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
## 2019-11-03 LAB — BASIC METABOLIC PANEL
Anion gap: 9 (ref 5–15)
BUN: 15 mg/dL (ref 8–23)
CO2: 26 mmol/L (ref 22–32)
Calcium: 7.8 mg/dL — ABNORMAL LOW (ref 8.9–10.3)
Chloride: 104 mmol/L (ref 98–111)
Creatinine, Ser: 0.97 mg/dL (ref 0.61–1.24)
GFR calc Af Amer: >60 mL/min (ref >60)
GFR calc non Af Amer: >60 mL/min (ref >60)
Glucose, Bld: 133 mg/dL — ABNORMAL HIGH (ref 70–99)
Potassium: 4 mmol/L (ref 3.5–5.1)
Sodium: 139 mmol/L (ref 135–145)

## 2019-11-03 LAB — CBC
HCT: 25.4 % — ABNORMAL LOW (ref 39.0–52.0)
HCT: 24.1 % — ABNORMAL LOW (ref 39.0–52.0)
Hemoglobin: 7.9 g/dL — ABNORMAL LOW (ref 13.0–17.0)
Hemoglobin: 7.2 g/dL — ABNORMAL LOW (ref 13.0–17.0)
MCH: 27.6 pg (ref 26.0–34.0)
MCH: 27.4 pg (ref 26.0–34.0)
MCHC: 31.1 g/dL (ref 30.0–36.0)
MCHC: 29.9 g/dL — ABNORMAL LOW (ref 30.0–36.0)
MCV: 88.8 fL (ref 80.0–100.0)
MCV: 91.6 fL (ref 80.0–100.0)
Platelets: 228 10*3/uL (ref 150–400)
Platelets: 184 10*3/uL (ref 150–400)
RBC: 2.86 MIL/uL — ABNORMAL LOW (ref 4.22–5.81)
RBC: 2.63 MIL/uL — ABNORMAL LOW (ref 4.22–5.81)
RDW: 13.2 % (ref 11.5–15.5)
RDW: 13.3 % (ref 11.5–15.5)
WBC: 12.3 10*3/uL — ABNORMAL HIGH (ref 4.0–10.5)
WBC: 10.4 10*3/uL (ref 4.0–10.5)
nRBC: 0 % (ref 0.0–0.2)
nRBC: 0 % (ref 0.0–0.2)
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
## 2019-11-03 NOTE — Progress Notes (Signed)
Inpatient Rehab Admissions Coordinator Note:   Per therapy recommendations, pt was screened for CIR candidacy by Clemens Catholic, [place] CCC-SLP. At this time, Pt. Is not yet participating in bed mobility or transfers. Will follow from a distance to determine if Pt. Can tolerate CIR level therapies.   Clemens Catholic, [place], [place] Admissions Coordinator  [phone] ([place]) [phone] (office)
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
## 2019-11-04 LAB — CBC
HCT: 22.5 % — ABNORMAL LOW (ref 39.0–52.0)
Hemoglobin: 7.1 g/dL — ABNORMAL LOW (ref 13.0–17.0)
MCH: 28.6 pg (ref 26.0–34.0)
MCHC: 31.6 g/dL (ref 30.0–36.0)
MCV: 90.7 fL (ref 80.0–100.0)
Platelets: 234 10*3/uL (ref 150–400)
RBC: 2.48 MIL/uL — ABNORMAL LOW (ref 4.22–5.81)
RDW: 13.1 % (ref 11.5–15.5)
WBC: 11.8 10*3/uL — ABNORMAL HIGH (ref 4.0–10.5)
nRBC: 0 % (ref 0.0–0.2)

## 2019-11-04 MED ORDER — TAMSULOSIN HCL 0.4 MG PO CAPS
0.4000 mg | ORAL_CAPSULE | Freq: Every day | ORAL | Status: DC
  Administered 2019-11-04 – 2019-11-15 (×12): 0.4 mg via ORAL
  Filled 2019-11-04 (×12): qty 1

## 2019-11-04 MED ORDER — GABAPENTIN 300 MG PO CAPS
300.0000 mg | ORAL_CAPSULE | Freq: Three times a day (TID) | ORAL | Status: DC
  Administered 2019-11-04 – 2019-11-15 (×35): 300 mg via ORAL
  Filled 2019-11-04 (×37): qty 1

## 2019-11-04 MED ORDER — HYDROMORPHONE HCL 1 MG/ML IJ SOLN
0.5000 mg | INTRAMUSCULAR | Status: DC | PRN
  Administered 2019-11-04 – 2019-11-15 (×37): 0.5 mg via INTRAVENOUS
  Filled 2019-11-04 (×38): qty 1

## 2019-11-04 NOTE — Plan of Care (Signed)

## 2019-11-04 NOTE — Progress Notes (Signed)
Inpatient Rehab Admissions Coordinator Note:   Per therapy recommendations, pt was re-screened for CIR candidacy by Clemens Catholic, [place] CCC-SLP. At this time, Pt. Appears to have functional decline and is demonstrating improved participation with therapies. Will place order for rehab consult per protocol.  Please contact me with questions.   Clemens Catholic, [place], [place] Admissions Coordinator  [phone] ([place]) [phone] (office)
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
## 2019-11-05 LAB — CBC
HCT: 23.7 % — ABNORMAL LOW (ref 39.0–52.0)
Hemoglobin: 7.4 g/dL — ABNORMAL LOW (ref 13.0–17.0)
MCH: 28.4 pg (ref 26.0–34.0)
MCHC: 31.2 g/dL (ref 30.0–36.0)
MCV: 90.8 fL (ref 80.0–100.0)
Platelets: 304 10*3/uL (ref 150–400)
RBC: 2.61 MIL/uL — ABNORMAL LOW (ref 4.22–5.81)
RDW: 13.2 % (ref 11.5–15.5)
WBC: 11.8 10*3/uL — ABNORMAL HIGH (ref 4.0–10.5)
nRBC: 0 % (ref 0.0–0.2)

## 2019-11-05 MED ORDER — TRAMADOL HCL 50 MG PO TABS
50.0000 mg | ORAL_TABLET | Freq: Four times a day (QID) | ORAL | Status: DC | PRN
  Administered 2019-11-05 – 2019-11-10 (×3): 50 mg via ORAL
  Filled 2019-11-05 (×3): qty 1

## 2019-11-05 MED ORDER — KETOROLAC TROMETHAMINE 15 MG/ML IJ SOLN
15.0000 mg | Freq: Four times a day (QID) | INTRAMUSCULAR | Status: AC
  Administered 2019-11-05 – 2019-11-10 (×19): 15 mg via INTRAVENOUS
  Filled 2019-11-05 (×19): qty 1
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
## 2019-11-05 NOTE — Progress Notes (Signed)
Wife here stating that the pt goes to a wellness clinic called [facility] in  [place] and gets pain medicine(I think it is Buprenorphine but I am not sure). Angie is at the clinic at fax#[phone]. if a release of information needs to be done. Called Dr. Dema Severin and informed him that pt is crying and very anxious and agitated and about above. We will give him the dilaudid IV now and then the oxy will be due at 2130.

## 2019-11-05 NOTE — Progress Notes (Signed)
Inpatient Rehab Admissions Coordinator:   Attempted to meet with pt again at bedside.  He is not as agitated as when I saw him previously, but does not engage much.  Wife not present at bedside.  Spoke with PT and recs were updated to SNF. I would not be able to get CIR approval from [facility], so will sign off for now.  If pt improves and therapy feels CIR is appropriate, we can re-evaluate.  I let Saverio Danker, PA-C, and Ellan Lambert, RN CM, know.    Shann Medal, PT, DPT Admissions Coordinator [phone] 11/05/19  3:09 PM
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
## 2019-11-05 NOTE — Progress Notes (Signed)
Inpatient Rehab Admissions:  Inpatient Rehab Consult received.  I met with patient at the bedside for rehabilitation assessment and to discuss goals and expectations of an inpatient rehab admission.  Pt states his wife is looking into taking a leave from work to provide assist at home.  We discussed home access and probable need for a ramp, pt states that cannot be accomplished and became frustrated when I explained that he would likely be only able to transfer in/out of a w/c for many weeks.  He states he cannot stay away from home for several more weeks and that he's already "going crazy as it is".  I will try and return this afternoon to speak with patient and his wife, as she should be here visiting, per patient.   Signed: Shann Medal, PT, DPT Admissions Coordinator [phone] 11/05/19  12:29 PM

## 2019-11-05 NOTE — Progress Notes (Signed)
Pt is very agitated and wants another Klonipin, talking to wife.
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
## 2019-11-06 ENCOUNTER — Encounter (HOSPITAL_COMMUNITY): Payer: Self-pay

## 2019-11-06 LAB — CBC
HCT: 22.2 % — ABNORMAL LOW (ref 39.0–52.0)
Hemoglobin: 6.9 g/dL — CL (ref 13.0–17.0)
MCH: 28.4 pg (ref 26.0–34.0)
MCHC: 31.1 g/dL (ref 30.0–36.0)
MCV: 91.4 fL (ref 80.0–100.0)
Platelets: 301 10*3/uL (ref 150–400)
RBC: 2.43 MIL/uL — ABNORMAL LOW (ref 4.22–5.81)
RDW: 13.2 % (ref 11.5–15.5)
WBC: 10.9 10*3/uL — ABNORMAL HIGH (ref 4.0–10.5)
nRBC: 0 % (ref 0.0–0.2)

## 2019-11-06 LAB — PREPARE RBC (CROSSMATCH)

## 2019-11-06 LAB — HEMOGLOBIN AND HEMATOCRIT, BLOOD
HCT: 25.6 % — ABNORMAL LOW (ref 39.0–52.0)
Hemoglobin: 8.1 g/dL — ABNORMAL LOW (ref 13.0–17.0)

## 2019-11-06 MED ORDER — SODIUM CHLORIDE 0.9% IV SOLUTION: Freq: Once | INTRAVENOUS | Status: DC

## 2019-11-06 NOTE — Progress Notes (Signed)
Patient and family requesting immodium or similar medication for patient's diarrhea. Spoke with on-call physician, Dr Kae Heller, who recommends we wait and see how he does overnight and if still having diarrhea, will address in the AM. Notified patient and family and they are agreeable with this plan.
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
## 2019-11-06 NOTE — Care Management Important Message (Signed)
Important Message  Patient Details  Name: Patrick Cruz MRN: [id number] Date of Birth: 07/04/1960   Medicare Important Message Given:  Yes     [name] 11/06/2019, 4:38 PM
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
## 2019-11-06 NOTE — H&P (Shared)
Patient unwilling to answer medical history questions at this time ,

## 2019-11-06 NOTE — Progress Notes (Signed)
CRITICAL VALUE ALERT  Critical Value: Hemoglobin 6.9  Date & Time Notied: 11/06/2019 0130  Provider Notified: Nadeen Landau, MD  Orders Received/Actions taken: MD paged about critical value. MD requested 1 PRBCs to be infused through verbal order. MD notified again about putting consent order for this RN unable to do so. Charge Nurse and House RN notified per MD request. Type and screen ordered. Will follow up after type and screen completed.

## 2019-11-07 LAB — TYPE AND SCREEN
ABO/RH(D): O POS
Antibody Screen: NEGATIVE
Unit division: 0

## 2019-11-07 LAB — CBC
HCT: 24.5 % — ABNORMAL LOW (ref 39.0–52.0)
Hemoglobin: 7.5 g/dL — ABNORMAL LOW (ref 13.0–17.0)
MCH: 27.7 pg (ref 26.0–34.0)
MCHC: 30.6 g/dL (ref 30.0–36.0)
MCV: 90.4 fL (ref 80.0–100.0)
Platelets: 312 10*3/uL (ref 150–400)
RBC: 2.71 MIL/uL — ABNORMAL LOW (ref 4.22–5.81)
RDW: 13.3 % (ref 11.5–15.5)
WBC: 13.3 10*3/uL — ABNORMAL HIGH (ref 4.0–10.5)
nRBC: 0 % (ref 0.0–0.2)

## 2019-11-07 LAB — BPAM RBC
Blood Product Expiration Date: 202110302359
ISSUE DATE / TIME: 202110051141
Unit Type and Rh: 5100

## 2019-11-07 LAB — SARS CORONAVIRUS 2 BY RT PCR (HOSPITAL ORDER, PERFORMED IN ~~LOC~~ HOSPITAL LAB): SARS Coronavirus 2: NEGATIVE

## 2019-11-07 MED ORDER — CLONAZEPAM 1 MG PO TABS
1.0000 mg | ORAL_TABLET | Freq: Three times a day (TID) | ORAL | Status: DC
  Administered 2019-11-07 – 2019-11-15 (×25): 1 mg via ORAL
  Filled 2019-11-07 (×16): qty 1
  Filled 2019-11-07: qty 2
  Filled 2019-11-07 (×8): qty 1
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
## 2019-11-07 NOTE — Progress Notes (Signed)
   11/07/19 1436  Clinical Encounter Type  Visited With Patient  Visit Type Initial  Referral From Nurse  Consult/Referral To Chaplain   Chaplain responded to referral. Chaplain provided emotional support. Pt was tearful most of the visit. Chaplain remains available as needed.  This note was prepared by Chaplain Resident, Dante Gang, MDiv. For questions, please contact by phone at [phone].
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
## 2019-11-08 LAB — CBC
HCT: 25.6 % — ABNORMAL LOW (ref 39.0–52.0)
Hemoglobin: 7.9 g/dL — ABNORMAL LOW (ref 13.0–17.0)
MCH: 28.2 pg (ref 26.0–34.0)
MCHC: 30.9 g/dL (ref 30.0–36.0)
MCV: 91.4 fL (ref 80.0–100.0)
Platelets: 405 10*3/uL — ABNORMAL HIGH (ref 150–400)
RBC: 2.8 MIL/uL — ABNORMAL LOW (ref 4.22–5.81)
RDW: 13.5 % (ref 11.5–15.5)
WBC: 13.1 10*3/uL — ABNORMAL HIGH (ref 4.0–10.5)
nRBC: 0 % (ref 0.0–0.2)
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
## 2019-11-08 NOTE — TOC Progression Note (Signed)
Transition of Care [facility]) - Progression Note    Patient Details  Name: Patrick Cruz MRN: [id number] Date of Birth: 1960/06/06  Transition [facility] CM/SW Contact  Oren Section Cleta Alberts, RN Phone Number: 11/08/2019, 5:14 PM  Clinical Narrative:   Met with patient to discuss bed offers for skilled nursing facility.  He chooses [facility] [place] for SNF.  Notified Debbie in admissions at chosen facility; she will initiate insurance authorization for SNF stay.  Will provide updates as they are available.    Expected Discharge Plan: Skilled Nursing Facility Barriers to Discharge: [employer]  Expected Discharge Plan and Services Expected Discharge Plan: [place]   Discharge Planning Services: CM Consult Post Acute Care Choice: [place] Living arrangements for the past 2 months: Single Family Home                                       Social Determinants of Health (SDOH) Interventions    Readmission Risk Interventions No flowsheet data found.  Reinaldo Raddle, RN, BSN  Trauma/Neuro ICU Case Manager [phone]
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
## 2019-11-09 LAB — SARS CORONAVIRUS 2 BY RT PCR (HOSPITAL ORDER, PERFORMED IN ~~LOC~~ HOSPITAL LAB): SARS Coronavirus 2: NEGATIVE
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
## 2019-11-09 NOTE — Discharge Instructions (Signed)
You will need to obtain a primary care physician to follow up on incidental findings of an adrenal nodule as well as ascending aortic dilatation up to 4.1cm  Injuries:  1.  Left comminuted basicervical fracture status post cephalomedullary nailing 2.  Right tibia and fibula fracture status post intramedullary nailing 3.  Right possible nondisplaced impaction greater tuberosity fracture plan for nonoperative treatment  Weightbearing:                   - Weightbearing as tolerated right upper extremity with range of motion as tolerated                  - weightbearing for transfers only to bilateral lower extremities.  No walker ambulation.  Insicional and dressing care:  Okay to leave incisions open to air. Leave steri- strips on right leg in place.   Orthopedic device(s): Boot to the right lower extremity for transferring.  Rib Fracture  A rib fracture is a break or crack in one of the bones of the ribs. The ribs are like a cage that goes around your upper chest. A broken or cracked rib is often painful, but most do not cause other problems. Most rib fractures usually heal on their own in 1-3 months. Follow these instructions at home: Managing pain, stiffness, and swelling  If directed, apply ice to the injured area. ? Put ice in a plastic bag. ? Place a towel between your skin and the bag. ? Leave the ice on for 20 minutes, 2-3 times a day.  Take over-the-counter and prescription medicines only as told by your doctor. Activity  Avoid activities that cause pain to the injured area. Protect your injured area.  Slowly increase activity as told by your doctor. General instructions  Do deep breathing as told by your doctor. You may be told to: ? Take deep breaths many times a day. ? Cough many times a day while hugging a pillow. ? Use a device (incentive spirometer) to do deep breathing many times a day.  Drink enough fluid to keep your pee (urine) clear or pale yellow.  Do  not wear a rib belt or binder. These do not allow you to breathe deeply.  Keep all follow-up visits as told by your doctor. This is important. Contact a doctor if:  You have a fever. Get help right away if:  You have trouble breathing.  You are short of breath.  You cannot stop coughing.  You cough up thick or bloody spit (sputum).  You feel sick to your stomach (nauseous), throw up (vomit), or have belly (abdominal) pain.  Your pain gets worse and medicine does not help. Summary  A rib fracture is a break or crack in one of the bones of the ribs.  Apply ice to the injured area and take medicines for pain as told by your doctor.  Take deep breaths and cough many times a day. Hug a pillow every time you cough. This information is not intended to replace advice given to you by your health care provider. Make sure you discuss any questions you have with your health care provider. Document Revised: 12/31/2016 Document Reviewed: 04/20/2016 Elsevier Patient Education  2020 [employer].

## 2019-11-10 NOTE — Plan of Care (Signed)

## 2019-11-10 NOTE — TOC Progression Note (Signed)
Transition of Care [facility]) - Progression Note    Patient Details  Name: Patrick Cruz MRN: [id number] Date of Birth: 12-19-1960  Transition of Care [facility]) CM/SW [place], [place] Phone Number: [phone] 11/10/2019, 12:21 PM  Clinical Narrative:     CSW reached at to [facility] to ascertain if the authorization had come back. Debbie informed CSW that it had not. CSW requested that Debbie follow up with CSW if authorization comes back.  TOC team will continue to assist with discharge planning needs.  Expected Discharge Plan: Skilled Nursing Facility Barriers to Discharge: Ship broker  Expected Discharge Plan and Services Expected Discharge Plan: [place]   Discharge Planning Services: CM Consult Post Acute Care Choice: [place] Living arrangements for the past 2 months: Single Family Home                                       Social Determinants of Health (SDOH) Interventions    Readmission Risk Interventions No flowsheet data found.
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
## 2019-11-12 ENCOUNTER — Encounter (HOSPITAL_COMMUNITY): Payer: Medicare HMO

## 2019-11-12 MED ORDER — TRAMADOL HCL 50 MG PO TABS
50.0000 mg | ORAL_TABLET | Freq: Four times a day (QID) | ORAL | Status: DC
  Administered 2019-11-12 – 2019-11-15 (×13): 50 mg via ORAL
  Filled 2019-11-12 (×13): qty 1

## 2019-11-12 NOTE — Plan of Care (Signed)
  Problem: Education: Goal: Knowledge of General Education information will improve Description Including pain rating scale, medication(s)/side effects and non-pharmacologic comfort measures Outcome: Progressing   
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
## 2019-11-13 ENCOUNTER — Inpatient Hospital Stay (HOSPITAL_COMMUNITY): Payer: Medicare HMO

## 2019-11-13 DIAGNOSIS — M79609 Pain in unspecified limb: Secondary | ICD-10-CM

## 2019-11-13 DIAGNOSIS — M7989 Other specified soft tissue disorders: Secondary | ICD-10-CM

## 2019-11-13 DIAGNOSIS — R609 Edema, unspecified: Secondary | ICD-10-CM

## 2019-11-13 NOTE — Progress Notes (Signed)
Right Lower Ext. study completed.   See CVProc for preliminary results.   Kristianna Saperstein, RDMS, RVT 

## 2019-11-13 NOTE — Plan of Care (Signed)
  Problem: Education: Goal: Knowledge of General Education information will improve Description Including pain rating scale, medication(s)/side effects and non-pharmacologic comfort measures Outcome: Progressing   

## 2019-11-13 NOTE — TOC Progression Note (Signed)
Transition of Care [facility]) - Progression Note    Patient Details  Name: Patrick Cruz MRN: [id number] Date of Birth: [birth year]  [facility] CM/SW Contact  Ella Bodo, RN Phone Number: 11/13/2019, 5:07 PM  Clinical Narrative:  Insurance authorization still pending for SNF stay; left message for Debbie at [facility] to check for any updates.   Will follow.     Expected Discharge Plan: Skilled Nursing Facility Barriers to Discharge: Ship broker  Expected Discharge Plan and Services Expected Discharge Plan: [place]   Discharge Planning Services: CM Consult Post Acute Care Choice: [place] Living arrangements for the past 2 months: Single Family Home                                       Social Determinants of Health (SDOH) Interventions    Readmission Risk Interventions No flowsheet data found.  Reinaldo Raddle, RN, BSN  Trauma/Neuro ICU Case Manager [phone]

## 2019-11-13 NOTE — Plan of Care (Signed)
Sporadic crying episodes continue
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
## 2019-11-15 ENCOUNTER — Inpatient Hospital Stay: Payer: Medicare HMO

## 2019-11-15 DIAGNOSIS — I77810 Thoracic aortic ectasia: Secondary | ICD-10-CM | POA: Diagnosis not present

## 2019-11-15 DIAGNOSIS — S2242XD Multiple fractures of ribs, left side, subsequent encounter for fracture with routine healing: Secondary | ICD-10-CM | POA: Diagnosis not present

## 2019-11-15 DIAGNOSIS — R45851 Suicidal ideations: Secondary | ICD-10-CM | POA: Diagnosis not present

## 2019-11-15 DIAGNOSIS — S2242XA Multiple fractures of ribs, left side, initial encounter for closed fracture: Secondary | ICD-10-CM | POA: Diagnosis not present

## 2019-11-15 DIAGNOSIS — F331 Major depressive disorder, recurrent, moderate: Secondary | ICD-10-CM | POA: Diagnosis not present

## 2019-11-15 DIAGNOSIS — S82201D Unspecified fracture of shaft of right tibia, subsequent encounter for closed fracture with routine healing: Secondary | ICD-10-CM | POA: Diagnosis not present

## 2019-11-15 DIAGNOSIS — D62 Acute posthemorrhagic anemia: Secondary | ICD-10-CM | POA: Diagnosis not present

## 2019-11-15 DIAGNOSIS — M79605 Pain in left leg: Secondary | ICD-10-CM | POA: Diagnosis not present

## 2019-11-15 DIAGNOSIS — Z743 Need for continuous supervision: Secondary | ICD-10-CM | POA: Diagnosis not present

## 2019-11-15 DIAGNOSIS — E279 Disorder of adrenal gland, unspecified: Secondary | ICD-10-CM | POA: Diagnosis not present

## 2019-11-15 DIAGNOSIS — I10 Essential (primary) hypertension: Secondary | ICD-10-CM | POA: Diagnosis not present

## 2019-11-15 DIAGNOSIS — D649 Anemia, unspecified: Secondary | ICD-10-CM | POA: Diagnosis not present

## 2019-11-15 DIAGNOSIS — R609 Edema, unspecified: Secondary | ICD-10-CM | POA: Diagnosis not present

## 2019-11-15 DIAGNOSIS — M79669 Pain in unspecified lower leg: Secondary | ICD-10-CM | POA: Diagnosis not present

## 2019-11-15 DIAGNOSIS — Z7401 Bed confinement status: Secondary | ICD-10-CM | POA: Diagnosis not present

## 2019-11-15 DIAGNOSIS — Z23 Encounter for immunization: Secondary | ICD-10-CM

## 2019-11-15 DIAGNOSIS — S2249XD Multiple fractures of ribs, unspecified side, subsequent encounter for fracture with routine healing: Secondary | ICD-10-CM | POA: Diagnosis not present

## 2019-11-15 DIAGNOSIS — E278 Other specified disorders of adrenal gland: Secondary | ICD-10-CM | POA: Diagnosis not present

## 2019-11-15 DIAGNOSIS — R279 Unspecified lack of coordination: Secondary | ICD-10-CM | POA: Diagnosis not present

## 2019-11-15 DIAGNOSIS — M79661 Pain in right lower leg: Secondary | ICD-10-CM | POA: Diagnosis not present

## 2019-11-15 DIAGNOSIS — R262 Difficulty in walking, not elsewhere classified: Secondary | ICD-10-CM | POA: Diagnosis not present

## 2019-11-15 DIAGNOSIS — M2550 Pain in unspecified joint: Secondary | ICD-10-CM | POA: Diagnosis not present

## 2019-11-15 DIAGNOSIS — R5381 Other malaise: Secondary | ICD-10-CM | POA: Diagnosis not present

## 2019-11-15 DIAGNOSIS — R52 Pain, unspecified: Secondary | ICD-10-CM | POA: Diagnosis not present

## 2019-11-15 DIAGNOSIS — S82401D Unspecified fracture of shaft of right fibula, subsequent encounter for closed fracture with routine healing: Secondary | ICD-10-CM | POA: Diagnosis not present

## 2019-11-15 DIAGNOSIS — R278 Other lack of coordination: Secondary | ICD-10-CM | POA: Diagnosis not present

## 2019-11-15 DIAGNOSIS — S82201A Unspecified fracture of shaft of right tibia, initial encounter for closed fracture: Secondary | ICD-10-CM | POA: Diagnosis not present

## 2019-11-15 DIAGNOSIS — F32A Depression, unspecified: Secondary | ICD-10-CM | POA: Diagnosis present

## 2019-11-15 DIAGNOSIS — Z7982 Long term (current) use of aspirin: Secondary | ICD-10-CM | POA: Diagnosis not present

## 2019-11-15 DIAGNOSIS — M79604 Pain in right leg: Secondary | ICD-10-CM | POA: Diagnosis not present

## 2019-11-15 DIAGNOSIS — S82401A Unspecified fracture of shaft of right fibula, initial encounter for closed fracture: Secondary | ICD-10-CM | POA: Diagnosis not present

## 2019-11-15 DIAGNOSIS — S42254S Nondisplaced fracture of greater tuberosity of right humerus, sequela: Secondary | ICD-10-CM | POA: Diagnosis not present

## 2019-11-15 DIAGNOSIS — G8918 Other acute postprocedural pain: Secondary | ICD-10-CM | POA: Diagnosis not present

## 2019-11-15 DIAGNOSIS — S72142D Displaced intertrochanteric fracture of left femur, subsequent encounter for closed fracture with routine healing: Secondary | ICD-10-CM | POA: Diagnosis not present

## 2019-11-15 DIAGNOSIS — M79662 Pain in left lower leg: Secondary | ICD-10-CM | POA: Diagnosis not present

## 2019-11-15 DIAGNOSIS — R69 Illness, unspecified: Secondary | ICD-10-CM | POA: Diagnosis not present

## 2019-11-15 DIAGNOSIS — R079 Chest pain, unspecified: Secondary | ICD-10-CM | POA: Diagnosis not present

## 2019-11-15 DIAGNOSIS — M6281 Muscle weakness (generalized): Secondary | ICD-10-CM | POA: Diagnosis not present

## 2019-11-15 LAB — RESPIRATORY PANEL BY RT PCR (FLU A&B, COVID)
Influenza A by PCR: NEGATIVE
Influenza B by PCR: NEGATIVE
SARS Coronavirus 2 by RT PCR: NEGATIVE

## 2019-11-15 MED ORDER — OXYCODONE HCL 10 MG PO TABS: 10.0000 mg | ORAL_TABLET | ORAL | 0 refills | Status: DC | PRN

## 2019-11-15 MED ORDER — POLYETHYLENE GLYCOL 3350 17 G PO PACK: 17.0000 g | PACK | Freq: Two times a day (BID) | ORAL | 0 refills | Status: DC

## 2019-11-15 MED ORDER — METHOCARBAMOL 500 MG PO TABS: 1000.0000 mg | ORAL_TABLET | Freq: Three times a day (TID) | ORAL | Status: DC | PRN

## 2019-11-15 MED ORDER — GABAPENTIN 300 MG PO CAPS
300.0000 mg | ORAL_CAPSULE | Freq: Three times a day (TID) | ORAL | Status: DC
Start: 2019-11-15 — End: 2020-04-15

## 2019-11-15 MED ORDER — DOCUSATE SODIUM 100 MG PO CAPS
100.0000 mg | ORAL_CAPSULE | Freq: Two times a day (BID) | ORAL | 0 refills | Status: DC
Start: 2019-11-15 — End: 2020-04-15

## 2019-11-15 MED ORDER — BACITRACIN ZINC 500 UNIT/GM EX OINT: TOPICAL_OINTMENT | Freq: Two times a day (BID) | CUTANEOUS | 0 refills | Status: DC

## 2019-11-15 MED ORDER — VITAMIN D3 25 MCG PO TABS: 2000.0000 [IU] | ORAL_TABLET | Freq: Two times a day (BID) | ORAL | Status: DC

## 2019-11-15 MED ORDER — TRAMADOL HCL 50 MG PO TABS
50.0000 mg | ORAL_TABLET | Freq: Four times a day (QID) | ORAL | 0 refills | Status: DC
Start: 2019-11-15 — End: 2020-04-15

## 2019-11-15 MED ORDER — ACETAMINOPHEN 500 MG PO TABS
1000.0000 mg | ORAL_TABLET | Freq: Three times a day (TID) | ORAL | 0 refills | Status: DC
Start: 2019-11-15 — End: 2021-07-26

## 2019-11-15 NOTE — Progress Notes (Signed)
Report called to facility, discharge transport in place. Patient agreeable with discharge at this time.

## 2019-11-15 NOTE — Progress Notes (Signed)
Consent for COVID vaccine given by Alferd Apa.  [facility] Alisa Stjames-RN-BSN-CCM   Covid-19 Vaccination Clinic  Name:  Patrick Cruz    MRN: [id number] DOB: [birth year]  11/15/2019  [name] was observed post Covid-19 immunization for 15 minutes without incident. He was provided with Vaccine Information Sheet and instruction to access the V-Safe system.   [name] was instructed to call 911 with any severe reactions post vaccine: Marland Kitchen Difficulty breathing  . Swelling of face and throat  . A fast heartbeat  . A bad rash all over body  . Dizziness and weakness   Immunizations Administered    Name Date Dose VIS Date Route   Moderna COVID-19 Vaccine 11/15/2019  1:00 AM 0.5 mL 01/2019 Intramuscular   Manufacturer: Moderna   Lot: [id number]   [place]: [phone]

## 2019-11-15 NOTE — Progress Notes (Addendum)
PT discharged per [facility].

## 2019-11-15 NOTE — TOC Transition Note (Addendum)
Transition of Care [facility]) - CM/SW Discharge Note   Patient Details  Name: RAPHEL STICKLES MRN: [id number] Date of Birth: 11/12/1960  [facility] of [facility] CM/SW Contact:  Ella Bodo, RN Phone Number: 11/15/2019, 1:39 PM   Clinical Narrative:   Pt has received insurance authorization for admission to [facility]  Notified provider and patient.  Pt will need rapid Covid test and dc summary.  Will send dc summary and transfer report to facility when available.  Pt will dc to room B20 at facility; bedside nurse will need to call report to [phone].    Will call [facility] for transport once Covid test results.     Addendum:  1536 pm Covid result negative.  [facility] called for transport.      Final next level of care: Skilled Nursing Facility Barriers to Discharge: Barriers Resolved   Patient Goals and CMS Choice Patient states their goals for this hospitalization and ongoing recovery are:: to get back home CMS Medicare.gov Compare Post Acute Care list provided to:: Patient Choice offered to / list presented to : Patient  Discharge Placement PASRR number recieved: 11/06/19            Patient chooses bed at: Other - please specify in the comment section below: [facility]) Patient to be transferred to facility by: [place] Name of family member notified: Pt to notify his spouse Patient and family notified of of transfer: 11/15/19  Discharge Plan and Services   Discharge Planning Services: CM Consult Post Acute Care Choice: [place]                               Social Determinants of Health (SDOH) Interventions     Readmission Risk Interventions No flowsheet data found.  Reinaldo Raddle, RN, BSN  Trauma/Neuro ICU Case Manager [phone]
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
## 2019-11-16 ENCOUNTER — Other Ambulatory Visit: Payer: Self-pay

## 2019-11-16 ENCOUNTER — Emergency Department (HOSPITAL_COMMUNITY)
Admission: EM | Admit: 2019-11-16 | Discharge: 2019-11-16 | Disposition: A | Discharge status: Skilled Nursing Facility | Payer: Medicare HMO | Attending: Emergency Medicine | Admitting: Emergency Medicine

## 2019-11-16 ENCOUNTER — Encounter (HOSPITAL_COMMUNITY): Payer: Self-pay | Admitting: Emergency Medicine

## 2019-11-16 ENCOUNTER — Emergency Department (HOSPITAL_COMMUNITY)
Admission: EM | Admit: 2019-11-16 | Discharge: 2019-11-16 | Disposition: A | Discharge status: Rehabilitation Facility | Payer: Medicare HMO | Source: Home / Self Care | Attending: Emergency Medicine | Admitting: Emergency Medicine

## 2019-11-16 DIAGNOSIS — I10 Essential (primary) hypertension: Secondary | ICD-10-CM | POA: Insufficient documentation

## 2019-11-16 DIAGNOSIS — Z7401 Bed confinement status: Secondary | ICD-10-CM | POA: Diagnosis not present

## 2019-11-16 DIAGNOSIS — M79661 Pain in right lower leg: Secondary | ICD-10-CM | POA: Diagnosis not present

## 2019-11-16 DIAGNOSIS — G8918 Other acute postprocedural pain: Secondary | ICD-10-CM

## 2019-11-16 DIAGNOSIS — Z7982 Long term (current) use of aspirin: Secondary | ICD-10-CM | POA: Diagnosis not present

## 2019-11-16 DIAGNOSIS — M79604 Pain in right leg: Secondary | ICD-10-CM | POA: Diagnosis not present

## 2019-11-16 DIAGNOSIS — M79605 Pain in left leg: Secondary | ICD-10-CM | POA: Insufficient documentation

## 2019-11-16 DIAGNOSIS — R69 Illness, unspecified: Secondary | ICD-10-CM | POA: Diagnosis not present

## 2019-11-16 DIAGNOSIS — R079 Chest pain, unspecified: Secondary | ICD-10-CM | POA: Insufficient documentation

## 2019-11-16 DIAGNOSIS — M79606 Pain in leg, unspecified: Secondary | ICD-10-CM

## 2019-11-16 DIAGNOSIS — M79669 Pain in unspecified lower leg: Secondary | ICD-10-CM | POA: Insufficient documentation

## 2019-11-16 DIAGNOSIS — R52 Pain, unspecified: Secondary | ICD-10-CM | POA: Diagnosis not present

## 2019-11-16 MED ORDER — OXYCODONE HCL 5 MG PO TABS
15.0000 mg | ORAL_TABLET | ORAL | Status: DC | PRN
  Administered 2019-11-16 (×2): 15 mg via ORAL
  Filled 2019-11-16 (×2): qty 3

## 2019-11-16 MED ORDER — CLONAZEPAM 0.5 MG PO TABS
1.0000 mg | ORAL_TABLET | Freq: Two times a day (BID) | ORAL | Status: DC
  Administered 2019-11-16: 1 mg via ORAL
  Filled 2019-11-16: qty 2

## 2019-11-16 MED ORDER — POLYETHYLENE GLYCOL 3350 17 G PO PACK: 17.0000 g | PACK | Freq: Every day | ORAL | Status: DC | PRN

## 2019-11-16 MED ORDER — PAROXETINE HCL 20 MG PO TABS
60.0000 mg | ORAL_TABLET | Freq: Every day | ORAL | Status: DC
  Filled 2019-11-16 (×3): qty 2

## 2019-11-16 MED ORDER — ONDANSETRON HCL 4 MG PO TABS: 4.0000 mg | ORAL_TABLET | Freq: Three times a day (TID) | ORAL | Status: DC | PRN

## 2019-11-16 MED ORDER — OXYCODONE HCL 5 MG PO TABS
15.0000 mg | ORAL_TABLET | Freq: Once | ORAL | Status: AC
  Administered 2019-11-16: 15 mg via ORAL
  Filled 2019-11-16: qty 3

## 2019-11-16 MED ORDER — TRAMADOL HCL 50 MG PO TABS
50.0000 mg | ORAL_TABLET | Freq: Four times a day (QID) | ORAL | Status: DC
  Administered 2019-11-16: 50 mg via ORAL
  Filled 2019-11-16: qty 1

## 2019-11-16 MED ORDER — OXYCODONE HCL 5 MG PO TABS
10.0000 mg | ORAL_TABLET | Freq: Once | ORAL | Status: AC
  Administered 2019-11-16: 10 mg via ORAL
  Filled 2019-11-16: qty 2

## 2019-11-16 MED ORDER — AMPHETAMINE-DEXTROAMPHETAMINE 10 MG PO TABS
30.0000 mg | ORAL_TABLET | Freq: Three times a day (TID) | ORAL | Status: DC
  Filled 2019-11-16: qty 3
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
## 2019-11-16 NOTE — ED Notes (Signed)
Per CSW and pt wife, pt can go back to [facility].

## 2019-11-16 NOTE — Discharge Instructions (Addendum)
Go directly to the rehab facility. They should be able to get the pain medication for you now.
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
## 2019-11-16 NOTE — ED Notes (Signed)
EMS has been called for patient.  

## 2019-11-16 NOTE — ED Notes (Addendum)
Pt wife called and stated "I can not send him back there, it is not suitable for my dog and I am in my 21s, I can't take of him." Pt wife consoled and attempted to explain care plan. Pt wife remain agitated and reported "I need to speak to someone this is not a good situation." pt given CSW number. Primary RN and EDP aware.

## 2019-11-16 NOTE — ED Notes (Signed)
This RN attempted to speak with primary nurse at [facility] x3 without success. Will attempt again at a later time.

## 2019-11-16 NOTE — ED Notes (Signed)
This RN to bedside, introduced self and explained role in plan of care. While at bedside, patient laying supine, moaning and this RN is unable to understand sentences by patient at this time. After redirection, patient states "I'm nothing but a pig, I'm in withdraw and in so much pain." Pt educated on current plan of care and is in agreement at this time. Pt educated on medication that was provided. Will continue to monitor at this time.

## 2019-11-16 NOTE — ED Notes (Signed)
Attempted to give report to [place]. Was put on hold for an extensive amount of time. Unable to give report.
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
## 2019-11-16 NOTE — ED Notes (Signed)
With assistance from patient, patient rolled and changed into a clean chuck pad and sheet at this time. Pt repositioned at this time and this RN provided and update to wife. Wife states "I want him transferred back to [facility] if at all possible. He's not to go back to [place]." This RN educated on current plan of care and addressed concerns at this time.

## 2019-11-16 NOTE — ED Provider Notes (Signed)
Wife and SW have talked, and he will be sent back to [facility]. Will d/c   Sherwood Gambler, MD 11/16/19 [phone]

## 2019-11-16 NOTE — ED Triage Notes (Signed)
Pt was d/c from here today, upon EMS arrival back to [facility], pt c/o hip pain, leg pain, rib pain and shoulder pain, EMS brought back
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
## 2019-11-16 NOTE — ED Notes (Addendum)
This RN to bedside after being notified that patient is refusing transport to [place]. On arrival to bedside, found patient to be tearful, wailing and crying. Pt making several statements of "I don't want to go back. I was told it was a high class place, and it's not." Pt began to scream, stating "I'm scared. They don't have any medicine. They don't have anything, they never answer when I need help. And nobody wants me to go home." This RN remained at bedside and allowed patient to express fears, frustration, and concerns. All addressed by this RN and educated on the process of a rehab facility and eased fears and frustration.  At this time, patient in agreement with transfer to [facility] and in agreement with current plan of care.

## 2019-11-16 NOTE — ED Notes (Signed)
Primary RN talking with pt and has agreed to go back to [facility]. EMS dropping off another patient and reported could take pt to facility.   Attempted to call [facility] but staff not available at this time.

## 2019-11-16 NOTE — ED Notes (Signed)
ED Secretary contacted EMS regarding transport back to [facility]. EMS stated would be awhile but pt is on the list for transport.

## 2019-11-16 NOTE — ED Triage Notes (Signed)
Pt d/c'd from [facility] just a few hours ago to [facility]. Pt arrive too late for the facility to be able to fill his pain medication. Pt sent here for pain management for the night.

## 2019-11-16 NOTE — ED Notes (Signed)
Pt states that he will not go back to [facility].

## 2019-11-16 NOTE — ED Notes (Signed)
Pt remains in agreement with transfer back to [facility] at this time. [facility] at bedside for transport at this time, all questions and concerns voiced addressed at this time. This RN on phone attempting to reach primary nurse at [facility] at this time.

## 2019-11-19 ENCOUNTER — Encounter (HOSPITAL_COMMUNITY): Payer: Self-pay | Admitting: *Deleted

## 2019-11-19 ENCOUNTER — Emergency Department (HOSPITAL_COMMUNITY)
Admission: EM | Admit: 2019-11-19 | Discharge: 2019-11-20 | Disposition: A | Discharge status: Skilled Nursing Facility | Payer: Medicare HMO | Attending: Emergency Medicine | Admitting: Emergency Medicine

## 2019-11-19 ENCOUNTER — Encounter (HOSPITAL_COMMUNITY): Payer: Self-pay | Admitting: Emergency Medicine

## 2019-11-19 ENCOUNTER — Other Ambulatory Visit: Payer: Self-pay

## 2019-11-19 DIAGNOSIS — R52 Pain, unspecified: Secondary | ICD-10-CM | POA: Diagnosis not present

## 2019-11-19 DIAGNOSIS — M79604 Pain in right leg: Secondary | ICD-10-CM | POA: Insufficient documentation

## 2019-11-19 DIAGNOSIS — D649 Anemia, unspecified: Secondary | ICD-10-CM | POA: Diagnosis not present

## 2019-11-19 DIAGNOSIS — S72142D Displaced intertrochanteric fracture of left femur, subsequent encounter for closed fracture with routine healing: Secondary | ICD-10-CM | POA: Diagnosis not present

## 2019-11-19 DIAGNOSIS — S82201D Unspecified fracture of shaft of right tibia, subsequent encounter for closed fracture with routine healing: Secondary | ICD-10-CM | POA: Diagnosis not present

## 2019-11-19 DIAGNOSIS — E278 Other specified disorders of adrenal gland: Secondary | ICD-10-CM | POA: Diagnosis not present

## 2019-11-19 DIAGNOSIS — S2242XD Multiple fractures of ribs, left side, subsequent encounter for fracture with routine healing: Secondary | ICD-10-CM | POA: Diagnosis not present

## 2019-11-19 DIAGNOSIS — I77810 Thoracic aortic ectasia: Secondary | ICD-10-CM | POA: Diagnosis not present

## 2019-11-19 DIAGNOSIS — M79661 Pain in right lower leg: Secondary | ICD-10-CM | POA: Diagnosis not present

## 2019-11-19 DIAGNOSIS — S82401D Unspecified fracture of shaft of right fibula, subsequent encounter for closed fracture with routine healing: Secondary | ICD-10-CM | POA: Diagnosis not present

## 2019-11-19 DIAGNOSIS — M79662 Pain in left lower leg: Secondary | ICD-10-CM | POA: Diagnosis not present

## 2019-11-19 DIAGNOSIS — Z7982 Long term (current) use of aspirin: Secondary | ICD-10-CM | POA: Diagnosis not present

## 2019-11-19 DIAGNOSIS — M79605 Pain in left leg: Secondary | ICD-10-CM | POA: Insufficient documentation

## 2019-11-19 MED ORDER — OXYCODONE-ACETAMINOPHEN 5-325 MG PO TABS
2.0000 | ORAL_TABLET | Freq: Once | ORAL | Status: AC
  Administered 2019-11-20: 2 via ORAL
  Filled 2019-11-19: qty 2

## 2019-11-19 NOTE — ED Triage Notes (Signed)
Pt brought in by [facility] for c/o pain control; pt states the doctor is changing his pain medication and putting him on suboxene in the am but pt states tonight he is having severe pain; pt states the facility gave him adderall tonight before bed

## 2019-11-20 DIAGNOSIS — S82201D Unspecified fracture of shaft of right tibia, subsequent encounter for closed fracture with routine healing: Secondary | ICD-10-CM | POA: Diagnosis not present

## 2019-11-20 DIAGNOSIS — S2242XD Multiple fractures of ribs, left side, subsequent encounter for fracture with routine healing: Secondary | ICD-10-CM | POA: Diagnosis not present

## 2019-11-20 DIAGNOSIS — R52 Pain, unspecified: Secondary | ICD-10-CM | POA: Diagnosis not present

## 2019-11-20 DIAGNOSIS — S72142D Displaced intertrochanteric fracture of left femur, subsequent encounter for closed fracture with routine healing: Secondary | ICD-10-CM | POA: Diagnosis not present

## 2019-11-20 MED ORDER — OXYCODONE HCL 5 MG PO TABS
5.0000 mg | ORAL_TABLET | Freq: Once | ORAL | Status: AC
  Administered 2019-11-20: 5 mg via ORAL
  Filled 2019-11-20: qty 1

## 2019-11-20 NOTE — ED Notes (Signed)
Pt given urinal.
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
## 2019-11-21 ENCOUNTER — Encounter (HOSPITAL_COMMUNITY): Payer: Self-pay | Admitting: Emergency Medicine

## 2019-11-21 ENCOUNTER — Emergency Department (HOSPITAL_COMMUNITY)
Admission: EM | Admit: 2019-11-21 | Discharge: 2019-11-22 | Disposition: A | Discharge status: Home / Self Care | Payer: Medicare HMO | Attending: Emergency Medicine | Admitting: Emergency Medicine

## 2019-11-21 ENCOUNTER — Other Ambulatory Visit: Payer: Self-pay

## 2019-11-21 DIAGNOSIS — F331 Major depressive disorder, recurrent, moderate: Secondary | ICD-10-CM | POA: Diagnosis not present

## 2019-11-21 DIAGNOSIS — F32A Depression, unspecified: Secondary | ICD-10-CM | POA: Diagnosis present

## 2019-11-21 DIAGNOSIS — R45851 Suicidal ideations: Secondary | ICD-10-CM | POA: Diagnosis not present

## 2019-11-21 DIAGNOSIS — R69 Illness, unspecified: Secondary | ICD-10-CM | POA: Diagnosis not present

## 2019-11-21 LAB — COMPREHENSIVE METABOLIC PANEL
ALT: 17 U/L (ref 0–44)
AST: 14 U/L — ABNORMAL LOW (ref 15–41)
Albumin: 3.6 g/dL (ref 3.5–5.0)
Alkaline Phosphatase: 236 U/L — ABNORMAL HIGH (ref 38–126)
Anion gap: 7 (ref 5–15)
BUN: 22 mg/dL (ref 8–23)
CO2: 26 mmol/L (ref 22–32)
Calcium: 8.8 mg/dL — ABNORMAL LOW (ref 8.9–10.3)
Chloride: 103 mmol/L (ref 98–111)
Creatinine, Ser: 0.91 mg/dL (ref 0.61–1.24)
GFR, Estimated: >60 mL/min (ref >60)
Glucose, Bld: 98 mg/dL (ref 70–99)
Potassium: 4.4 mmol/L (ref 3.5–5.1)
Sodium: 136 mmol/L (ref 135–145)
Total Bilirubin: 0.8 mg/dL (ref 0.3–1.2)
Total Protein: 7.3 g/dL (ref 6.5–8.1)

## 2019-11-21 LAB — CBC WITH DIFFERENTIAL/PLATELET
Abs Immature Granulocytes: 0.08 10*3/uL — ABNORMAL HIGH (ref 0.00–0.07)
Basophils Absolute: 0.1 10*3/uL (ref 0.0–0.1)
Basophils Relative: 1 %
Eosinophils Absolute: 0.3 10*3/uL (ref 0.0–0.5)
Eosinophils Relative: 3 %
HCT: 31.9 % — ABNORMAL LOW (ref 39.0–52.0)
Hemoglobin: 9.5 g/dL — ABNORMAL LOW (ref 13.0–17.0)
Immature Granulocytes: 1 %
Lymphocytes Relative: 40 %
Lymphs Abs: 4.8 10*3/uL — ABNORMAL HIGH (ref 0.7–4.0)
MCH: 27.6 pg (ref 26.0–34.0)
MCHC: 29.8 g/dL — ABNORMAL LOW (ref 30.0–36.0)
MCV: 92.7 fL (ref 80.0–100.0)
Monocytes Absolute: 1.1 10*3/uL — ABNORMAL HIGH (ref 0.1–1.0)
Monocytes Relative: 9 %
Neutro Abs: 5.7 10*3/uL (ref 1.7–7.7)
Neutrophils Relative %: 46 %
Platelets: 612 10*3/uL — ABNORMAL HIGH (ref 150–400)
RBC: 3.44 MIL/uL — ABNORMAL LOW (ref 4.22–5.81)
RDW: 14.4 % (ref 11.5–15.5)
WBC: 12 10*3/uL — ABNORMAL HIGH (ref 4.0–10.5)
nRBC: 0 % (ref 0.0–0.2)

## 2019-11-21 LAB — RAPID URINE DRUG SCREEN, HOSP PERFORMED
Amphetamines: POSITIVE — AB
Barbiturates: NOT DETECTED
Benzodiazepines: POSITIVE — AB
Cocaine: NOT DETECTED
Opiates: POSITIVE — AB
Tetrahydrocannabinol: NOT DETECTED

## 2019-11-21 LAB — ETHANOL: Alcohol, Ethyl (B): <10 mg/dL (ref <10)

## 2019-11-21 MED ORDER — HYDROMORPHONE HCL 1 MG/ML IJ SOLN
0.5000 mg | Freq: Once | INTRAMUSCULAR | Status: AC
  Administered 2019-11-21: 0.5 mg via INTRAMUSCULAR
  Filled 2019-11-21: qty 1

## 2019-11-21 MED ORDER — OXYCODONE HCL 5 MG PO TABS
15.0000 mg | ORAL_TABLET | Freq: Once | ORAL | Status: AC
  Administered 2019-11-21: 15 mg via ORAL
  Filled 2019-11-21: qty 3

## 2019-11-21 NOTE — ED Notes (Signed)
Per EDP, pt is not SI or HI and will be discharged; [facility] notified

## 2019-11-21 NOTE — ED Notes (Signed)
Attempted to give report to [employer]
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
## 2019-11-21 NOTE — ED Provider Notes (Signed)
Patient is not suicidal or homicidal at this time he is not hallucinating he will be discharged back to the nursing home he is being cared for   Patrick Ferguson, MD 11/21/19 2208

## 2019-11-21 NOTE — ED Notes (Signed)
[facility] called and states plan is to Observe overnight and reassess in am with social work consult.

## 2019-11-21 NOTE — Discharge Instructions (Addendum)
Follow up with your md as needed.  Take your medicines as prescribed
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
## 2019-11-22 DIAGNOSIS — R279 Unspecified lack of coordination: Secondary | ICD-10-CM | POA: Diagnosis not present

## 2019-11-22 DIAGNOSIS — Z743 Need for continuous supervision: Secondary | ICD-10-CM | POA: Diagnosis not present

## 2019-11-22 NOTE — ED Notes (Signed)
Pt tearful in bed. States he is in so much pain and is scared/ edp notified

## 2019-11-29 DIAGNOSIS — S82201D Unspecified fracture of shaft of right tibia, subsequent encounter for closed fracture with routine healing: Secondary | ICD-10-CM | POA: Diagnosis not present

## 2019-11-29 DIAGNOSIS — D649 Anemia, unspecified: Secondary | ICD-10-CM | POA: Diagnosis not present

## 2019-11-29 DIAGNOSIS — E278 Other specified disorders of adrenal gland: Secondary | ICD-10-CM | POA: Diagnosis not present

## 2019-11-29 DIAGNOSIS — S72142D Displaced intertrochanteric fracture of left femur, subsequent encounter for closed fracture with routine healing: Secondary | ICD-10-CM | POA: Diagnosis not present

## 2019-11-29 DIAGNOSIS — I77810 Thoracic aortic ectasia: Secondary | ICD-10-CM | POA: Diagnosis not present

## 2019-11-29 DIAGNOSIS — S2242XD Multiple fractures of ribs, left side, subsequent encounter for fracture with routine healing: Secondary | ICD-10-CM | POA: Diagnosis not present

## 2019-11-29 DIAGNOSIS — S82401D Unspecified fracture of shaft of right fibula, subsequent encounter for closed fracture with routine healing: Secondary | ICD-10-CM | POA: Diagnosis not present

## 2019-12-04 DIAGNOSIS — S72142D Displaced intertrochanteric fracture of left femur, subsequent encounter for closed fracture with routine healing: Secondary | ICD-10-CM | POA: Diagnosis not present

## 2019-12-04 DIAGNOSIS — S82201D Unspecified fracture of shaft of right tibia, subsequent encounter for closed fracture with routine healing: Secondary | ICD-10-CM | POA: Diagnosis not present

## 2019-12-06 DIAGNOSIS — S82201D Unspecified fracture of shaft of right tibia, subsequent encounter for closed fracture with routine healing: Secondary | ICD-10-CM | POA: Diagnosis not present

## 2019-12-06 DIAGNOSIS — D649 Anemia, unspecified: Secondary | ICD-10-CM | POA: Diagnosis not present

## 2019-12-06 DIAGNOSIS — S72142D Displaced intertrochanteric fracture of left femur, subsequent encounter for closed fracture with routine healing: Secondary | ICD-10-CM | POA: Diagnosis not present

## 2019-12-06 DIAGNOSIS — S2242XD Multiple fractures of ribs, left side, subsequent encounter for fracture with routine healing: Secondary | ICD-10-CM | POA: Diagnosis not present

## 2019-12-06 DIAGNOSIS — E278 Other specified disorders of adrenal gland: Secondary | ICD-10-CM | POA: Diagnosis not present

## 2019-12-06 DIAGNOSIS — R52 Pain, unspecified: Secondary | ICD-10-CM | POA: Diagnosis not present

## 2019-12-06 DIAGNOSIS — S82401D Unspecified fracture of shaft of right fibula, subsequent encounter for closed fracture with routine healing: Secondary | ICD-10-CM | POA: Diagnosis not present

## 2019-12-06 DIAGNOSIS — I77810 Thoracic aortic ectasia: Secondary | ICD-10-CM | POA: Diagnosis not present

## 2019-12-10 DIAGNOSIS — S82201D Unspecified fracture of shaft of right tibia, subsequent encounter for closed fracture with routine healing: Secondary | ICD-10-CM | POA: Diagnosis not present

## 2019-12-10 DIAGNOSIS — S2249XD Multiple fractures of ribs, unspecified side, subsequent encounter for fracture with routine healing: Secondary | ICD-10-CM | POA: Diagnosis not present

## 2019-12-10 DIAGNOSIS — M6281 Muscle weakness (generalized): Secondary | ICD-10-CM | POA: Diagnosis not present

## 2019-12-10 DIAGNOSIS — S72142D Displaced intertrochanteric fracture of left femur, subsequent encounter for closed fracture with routine healing: Secondary | ICD-10-CM | POA: Diagnosis not present

## 2019-12-14 DIAGNOSIS — R69 Illness, unspecified: Secondary | ICD-10-CM | POA: Diagnosis not present

## 2019-12-18 DIAGNOSIS — M5416 Radiculopathy, lumbar region: Secondary | ICD-10-CM | POA: Diagnosis not present

## 2019-12-18 DIAGNOSIS — R69 Illness, unspecified: Secondary | ICD-10-CM | POA: Diagnosis not present

## 2019-12-18 DIAGNOSIS — D649 Anemia, unspecified: Secondary | ICD-10-CM | POA: Diagnosis not present

## 2019-12-18 DIAGNOSIS — S82401D Unspecified fracture of shaft of right fibula, subsequent encounter for closed fracture with routine healing: Secondary | ICD-10-CM | POA: Diagnosis not present

## 2019-12-18 DIAGNOSIS — S2249XD Multiple fractures of ribs, unspecified side, subsequent encounter for fracture with routine healing: Secondary | ICD-10-CM | POA: Diagnosis not present

## 2019-12-18 DIAGNOSIS — M1990 Unspecified osteoarthritis, unspecified site: Secondary | ICD-10-CM | POA: Diagnosis not present

## 2019-12-18 DIAGNOSIS — S72142D Displaced intertrochanteric fracture of left femur, subsequent encounter for closed fracture with routine healing: Secondary | ICD-10-CM | POA: Diagnosis not present

## 2019-12-25 DIAGNOSIS — R69 Illness, unspecified: Secondary | ICD-10-CM | POA: Diagnosis not present

## 2020-01-01 DIAGNOSIS — S72142D Displaced intertrochanteric fracture of left femur, subsequent encounter for closed fracture with routine healing: Secondary | ICD-10-CM | POA: Diagnosis not present

## 2020-01-01 DIAGNOSIS — S82201D Unspecified fracture of shaft of right tibia, subsequent encounter for closed fracture with routine healing: Secondary | ICD-10-CM | POA: Diagnosis not present

## 2020-01-17 ENCOUNTER — Other Ambulatory Visit: Payer: Self-pay

## 2020-01-17 ENCOUNTER — Encounter (HOSPITAL_COMMUNITY): Payer: Self-pay | Admitting: Physical Therapy

## 2020-01-17 ENCOUNTER — Ambulatory Visit (HOSPITAL_COMMUNITY): Payer: Medicare HMO | Attending: Student | Admitting: Physical Therapy

## 2020-01-17 DIAGNOSIS — R262 Difficulty in walking, not elsewhere classified: Secondary | ICD-10-CM | POA: Diagnosis not present

## 2020-01-17 DIAGNOSIS — R2689 Other abnormalities of gait and mobility: Secondary | ICD-10-CM | POA: Diagnosis not present

## 2020-01-17 DIAGNOSIS — M6281 Muscle weakness (generalized): Secondary | ICD-10-CM | POA: Diagnosis not present
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
## 2020-01-22 ENCOUNTER — Ambulatory Visit (HOSPITAL_COMMUNITY): Payer: Medicare HMO | Admitting: Physical Therapy

## 2020-01-22 ENCOUNTER — Encounter (HOSPITAL_COMMUNITY): Payer: Self-pay | Admitting: Physical Therapy

## 2020-01-22 ENCOUNTER — Other Ambulatory Visit: Payer: Self-pay

## 2020-01-22 DIAGNOSIS — M6281 Muscle weakness (generalized): Secondary | ICD-10-CM

## 2020-01-22 DIAGNOSIS — R262 Difficulty in walking, not elsewhere classified: Secondary | ICD-10-CM

## 2020-01-22 DIAGNOSIS — R2689 Other abnormalities of gait and mobility: Secondary | ICD-10-CM | POA: Diagnosis not present

## 2020-01-22 NOTE — Patient Instructions (Signed)
Access Code: [email or web address] URL: [email or web address] Date: 01/22/2020 Prepared by: Mitzi Hansen Patrick Cruz  Exercises Sit to Stand with Arms Crossed - 2 x daily - 7 x weekly - 1 sets - 10 reps
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
## 2020-01-31 ENCOUNTER — Encounter (HOSPITAL_COMMUNITY): Payer: Self-pay | Admitting: Physical Therapy

## 2020-01-31 ENCOUNTER — Other Ambulatory Visit: Payer: Self-pay

## 2020-01-31 ENCOUNTER — Ambulatory Visit (HOSPITAL_COMMUNITY): Payer: Medicare HMO | Admitting: Physical Therapy

## 2020-01-31 DIAGNOSIS — R2689 Other abnormalities of gait and mobility: Secondary | ICD-10-CM

## 2020-01-31 DIAGNOSIS — M6281 Muscle weakness (generalized): Secondary | ICD-10-CM | POA: Diagnosis not present

## 2020-01-31 DIAGNOSIS — R262 Difficulty in walking, not elsewhere classified: Secondary | ICD-10-CM
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
## 2020-01-31 NOTE — Patient Instructions (Signed)
Access Code: [facility] URL: [email or web address] Date: 01/31/2020 Prepared by: Georges Lynch  Exercises Supine Transversus Abdominis Bracing - Hands on Stomach - 3 x daily - 7 x weekly - 2 sets - 10 reps - 5 seconds hold Standing Tandem Balance with Counter Support - 3 x daily - 7 x weekly - 1 sets - 3 reps - 15 seconds hold

## 2020-02-04 DIAGNOSIS — R030 Elevated blood-pressure reading, without diagnosis of hypertension: Secondary | ICD-10-CM | POA: Diagnosis not present

## 2020-02-04 DIAGNOSIS — Z6834 Body mass index (BMI) 34.0-34.9, adult: Secondary | ICD-10-CM | POA: Diagnosis not present

## 2020-02-04 DIAGNOSIS — M48061 Spinal stenosis, lumbar region without neurogenic claudication: Secondary | ICD-10-CM | POA: Diagnosis not present

## 2020-02-04 DIAGNOSIS — M5416 Radiculopathy, lumbar region: Secondary | ICD-10-CM | POA: Diagnosis not present

## 2020-02-05 ENCOUNTER — Ambulatory Visit (HOSPITAL_COMMUNITY): Payer: Medicare HMO | Attending: Student

## 2020-02-05 ENCOUNTER — Telehealth (HOSPITAL_COMMUNITY): Payer: Self-pay

## 2020-02-05 DIAGNOSIS — R262 Difficulty in walking, not elsewhere classified: Secondary | ICD-10-CM | POA: Insufficient documentation

## 2020-02-05 DIAGNOSIS — M6281 Muscle weakness (generalized): Secondary | ICD-10-CM | POA: Insufficient documentation

## 2020-02-05 DIAGNOSIS — R2689 Other abnormalities of gait and mobility: Secondary | ICD-10-CM | POA: Insufficient documentation

## 2020-02-05 NOTE — Telephone Encounter (Signed)
No show, called and left message concerning missed apt today.  Reminded next apt date and time wiht contact information included.  Included no show policy details in message as well.    Becky Sax, LPTA/CLT; [place] [phone]

## 2020-02-06 ENCOUNTER — Ambulatory Visit (HOSPITAL_COMMUNITY): Payer: Medicare HMO

## 2020-02-06 ENCOUNTER — Telehealth (HOSPITAL_COMMUNITY): Payer: Self-pay

## 2020-02-06 NOTE — Telephone Encounter (Signed)
pt cancelled appt for today because the electrician is coming

## 2020-02-07 ENCOUNTER — Ambulatory Visit (HOSPITAL_COMMUNITY): Payer: Medicare HMO | Admitting: Physical Therapy

## 2020-02-07 ENCOUNTER — Other Ambulatory Visit: Payer: Self-pay

## 2020-02-07 DIAGNOSIS — R262 Difficulty in walking, not elsewhere classified: Secondary | ICD-10-CM | POA: Diagnosis not present

## 2020-02-07 DIAGNOSIS — R2689 Other abnormalities of gait and mobility: Secondary | ICD-10-CM | POA: Diagnosis not present

## 2020-02-07 DIAGNOSIS — M6281 Muscle weakness (generalized): Secondary | ICD-10-CM
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
## 2020-02-11 ENCOUNTER — Ambulatory Visit (HOSPITAL_COMMUNITY): Payer: Medicare HMO | Admitting: Physical Therapy

## 2020-02-12 ENCOUNTER — Other Ambulatory Visit: Payer: Self-pay | Admitting: Neurosurgery

## 2020-02-12 ENCOUNTER — Other Ambulatory Visit (HOSPITAL_COMMUNITY): Payer: Self-pay | Admitting: Neurosurgery

## 2020-02-12 DIAGNOSIS — S82201D Unspecified fracture of shaft of right tibia, subsequent encounter for closed fracture with routine healing: Secondary | ICD-10-CM | POA: Diagnosis not present

## 2020-02-12 DIAGNOSIS — M5416 Radiculopathy, lumbar region: Secondary | ICD-10-CM

## 2020-02-12 DIAGNOSIS — S72142D Displaced intertrochanteric fracture of left femur, subsequent encounter for closed fracture with routine healing: Secondary | ICD-10-CM | POA: Diagnosis not present

## 2020-02-13 ENCOUNTER — Ambulatory Visit (HOSPITAL_COMMUNITY): Payer: Medicare HMO | Admitting: Physical Therapy

## 2020-02-13 ENCOUNTER — Other Ambulatory Visit: Payer: Self-pay

## 2020-02-13 DIAGNOSIS — R2689 Other abnormalities of gait and mobility: Secondary | ICD-10-CM | POA: Diagnosis not present

## 2020-02-13 DIAGNOSIS — M6281 Muscle weakness (generalized): Secondary | ICD-10-CM | POA: Diagnosis not present

## 2020-02-13 DIAGNOSIS — R262 Difficulty in walking, not elsewhere classified: Secondary | ICD-10-CM | POA: Diagnosis not present
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
## 2020-02-14 ENCOUNTER — Ambulatory Visit (HOSPITAL_COMMUNITY): Payer: Medicare HMO | Admitting: Physical Therapy

## 2020-02-14 ENCOUNTER — Telehealth (HOSPITAL_COMMUNITY): Payer: Self-pay

## 2020-02-14 DIAGNOSIS — R2689 Other abnormalities of gait and mobility: Secondary | ICD-10-CM

## 2020-02-14 DIAGNOSIS — M6281 Muscle weakness (generalized): Secondary | ICD-10-CM

## 2020-02-14 DIAGNOSIS — R262 Difficulty in walking, not elsewhere classified: Secondary | ICD-10-CM

## 2020-02-14 NOTE — Telephone Encounter (Signed)
No show, called and spoke to pt who stated he thought apt was scheduled today at 3:30.  Found opening at 1:15 and changed apt time.  Verbalized understanding for proper time later today.    Ihor Austin, LPTA/CLT; Delana Meyer [phone]
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
## 2020-02-15 ENCOUNTER — Ambulatory Visit (HOSPITAL_COMMUNITY): Payer: Medicare HMO

## 2020-02-15 ENCOUNTER — Other Ambulatory Visit: Payer: Self-pay

## 2020-02-15 ENCOUNTER — Encounter (HOSPITAL_COMMUNITY): Payer: Self-pay

## 2020-02-15 DIAGNOSIS — M6281 Muscle weakness (generalized): Secondary | ICD-10-CM | POA: Diagnosis not present

## 2020-02-15 DIAGNOSIS — R2689 Other abnormalities of gait and mobility: Secondary | ICD-10-CM

## 2020-02-15 DIAGNOSIS — R262 Difficulty in walking, not elsewhere classified: Secondary | ICD-10-CM
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
## 2020-02-18 ENCOUNTER — Ambulatory Visit (HOSPITAL_COMMUNITY): Payer: Medicare HMO | Admitting: Physical Therapy

## 2020-02-20 ENCOUNTER — Ambulatory Visit (HOSPITAL_COMMUNITY): Payer: Medicare HMO | Admitting: Physical Therapy

## 2020-02-21 ENCOUNTER — Ambulatory Visit (HOSPITAL_COMMUNITY): Payer: Medicare HMO | Admitting: Physical Therapy

## 2020-02-21 ENCOUNTER — Telehealth (HOSPITAL_COMMUNITY): Payer: Self-pay | Admitting: Physical Therapy

## 2020-02-21 NOTE — Telephone Encounter (Signed)
Pt is in a lot of pain and wants to cx today

## 2020-02-22 ENCOUNTER — Ambulatory Visit (HOSPITAL_COMMUNITY)
Admission: RE | Admit: 2020-02-22 | Discharge: 2020-02-22 | Disposition: A | Discharge status: Home / Self Care | Payer: Medicare HMO | Source: Ambulatory Visit | Attending: Neurosurgery | Admitting: Neurosurgery

## 2020-02-22 ENCOUNTER — Other Ambulatory Visit: Payer: Self-pay

## 2020-02-22 DIAGNOSIS — M5416 Radiculopathy, lumbar region: Secondary | ICD-10-CM | POA: Insufficient documentation

## 2020-02-22 DIAGNOSIS — M545 Low back pain, unspecified: Secondary | ICD-10-CM | POA: Diagnosis not present

## 2020-02-25 ENCOUNTER — Telehealth (HOSPITAL_COMMUNITY): Payer: Self-pay | Admitting: Physical Therapy

## 2020-02-25 ENCOUNTER — Ambulatory Visit (HOSPITAL_COMMUNITY): Payer: Medicare HMO | Admitting: Physical Therapy

## 2020-02-25 NOTE — Telephone Encounter (Signed)
Patient no show, spoke with patient who stated his wife was going to call to cancel for him. Patient stated he had an MRI on his back on Friday and was going to follow up with MD Tuesday. Patient stated he would follow up on Wednesday, as scheduled, or would call to cancel ahead of time if anything changes.   3:49 PM, 02/25/20 Mearl Latin PT, DPT Physical Therapist at [facility]

## 2020-02-26 DIAGNOSIS — Z6834 Body mass index (BMI) 34.0-34.9, adult: Secondary | ICD-10-CM | POA: Diagnosis not present

## 2020-02-26 DIAGNOSIS — M48061 Spinal stenosis, lumbar region without neurogenic claudication: Secondary | ICD-10-CM | POA: Diagnosis not present

## 2020-02-26 DIAGNOSIS — M5416 Radiculopathy, lumbar region: Secondary | ICD-10-CM | POA: Diagnosis not present

## 2020-02-26 DIAGNOSIS — R030 Elevated blood-pressure reading, without diagnosis of hypertension: Secondary | ICD-10-CM | POA: Diagnosis not present

## 2020-02-27 ENCOUNTER — Encounter (HOSPITAL_COMMUNITY): Payer: Self-pay | Admitting: Physical Therapy

## 2020-02-27 ENCOUNTER — Ambulatory Visit (HOSPITAL_COMMUNITY): Payer: Medicare HMO | Admitting: Physical Therapy

## 2020-02-27 NOTE — Therapy (Signed)
[place], [place], [address] Phone: [phone]   Fax:  [phone]  Patient Details  Name: Patrick Cruz MRN: [id number] Date of Birth: 11/19/1960 Referring Provider:  No ref. provider found  Encounter Date: 02/27/2020   PHYSICAL THERAPY DISCHARGE SUMMARY  Visits from Start of Care: 7  Current functional level related to goals / functional outcomes: Unknown as patient has not returned. Patient cancelled the remainder of his appointments because he is going to have a back surgery.    Remaining deficits: Unknown as patient has not returned.   Education / Equipment: HEP  Plan: Patient agrees to discharge.  Patient goals were partially met. Patient is being discharged due to the patient's request.  ?????      3:47 PM, 02/27/20 Mearl Latin PT, DPT Physical Therapist at [place], [place], [address] Phone: [phone]   Fax:  [phone]

## 2020-02-28 DIAGNOSIS — R69 Illness, unspecified: Secondary | ICD-10-CM | POA: Diagnosis not present

## 2020-02-29 ENCOUNTER — Encounter (HOSPITAL_COMMUNITY): Payer: Medicare HMO | Admitting: Physical Therapy

## 2020-03-01 ENCOUNTER — Emergency Department (HOSPITAL_COMMUNITY)
Admission: EM | Admit: 2020-03-01 | Discharge: 2020-03-01 | Disposition: A | Discharge status: Home / Self Care | Payer: Medicare HMO | Attending: Emergency Medicine | Admitting: Emergency Medicine

## 2020-03-01 DIAGNOSIS — M549 Dorsalgia, unspecified: Secondary | ICD-10-CM | POA: Diagnosis not present

## 2020-03-01 DIAGNOSIS — Z5321 Procedure and treatment not carried out due to patient leaving prior to being seen by health care provider: Secondary | ICD-10-CM | POA: Insufficient documentation

## 2020-03-13 ENCOUNTER — Ambulatory Visit (HOSPITAL_COMMUNITY)
Admission: RE | Admit: 2020-03-13 | Discharge: 2020-03-13 | Disposition: A | Discharge status: Home / Self Care | Payer: Medicare HMO | Attending: Psychiatry | Admitting: Psychiatry

## 2020-03-13 DIAGNOSIS — F1120 Opioid dependence, uncomplicated: Secondary | ICD-10-CM | POA: Diagnosis not present

## 2020-03-13 DIAGNOSIS — M79604 Pain in right leg: Secondary | ICD-10-CM | POA: Diagnosis not present

## 2020-03-13 DIAGNOSIS — G8929 Other chronic pain: Secondary | ICD-10-CM | POA: Diagnosis not present

## 2020-03-13 DIAGNOSIS — M4306 Spondylolysis, lumbar region: Secondary | ICD-10-CM | POA: Diagnosis not present

## 2020-03-13 DIAGNOSIS — M79605 Pain in left leg: Secondary | ICD-10-CM | POA: Diagnosis not present

## 2020-03-13 DIAGNOSIS — M4856XG Collapsed vertebra, not elsewhere classified, lumbar region, subsequent encounter for fracture with delayed healing: Secondary | ICD-10-CM | POA: Diagnosis not present

## 2020-03-13 DIAGNOSIS — M545 Low back pain, unspecified: Secondary | ICD-10-CM | POA: Diagnosis not present

## 2020-03-13 DIAGNOSIS — G894 Chronic pain syndrome: Secondary | ICD-10-CM | POA: Diagnosis not present

## 2020-03-13 DIAGNOSIS — Z79891 Long term (current) use of opiate analgesic: Secondary | ICD-10-CM | POA: Diagnosis not present
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
## 2020-03-27 DIAGNOSIS — F1120 Opioid dependence, uncomplicated: Secondary | ICD-10-CM | POA: Diagnosis not present

## 2020-03-27 DIAGNOSIS — M545 Low back pain, unspecified: Secondary | ICD-10-CM | POA: Diagnosis not present

## 2020-03-27 DIAGNOSIS — G894 Chronic pain syndrome: Secondary | ICD-10-CM | POA: Diagnosis not present

## 2020-03-27 DIAGNOSIS — G8929 Other chronic pain: Secondary | ICD-10-CM | POA: Diagnosis not present

## 2020-03-27 DIAGNOSIS — M79604 Pain in right leg: Secondary | ICD-10-CM | POA: Diagnosis not present

## 2020-03-27 DIAGNOSIS — Z79891 Long term (current) use of opiate analgesic: Secondary | ICD-10-CM | POA: Diagnosis not present

## 2020-03-27 DIAGNOSIS — M4306 Spondylolysis, lumbar region: Secondary | ICD-10-CM | POA: Diagnosis not present

## 2020-03-27 DIAGNOSIS — M4856XG Collapsed vertebra, not elsewhere classified, lumbar region, subsequent encounter for fracture with delayed healing: Secondary | ICD-10-CM | POA: Diagnosis not present

## 2020-03-27 DIAGNOSIS — M79605 Pain in left leg: Secondary | ICD-10-CM | POA: Diagnosis not present

## 2020-04-01 ENCOUNTER — Emergency Department (HOSPITAL_COMMUNITY)
Admission: EM | Admit: 2020-04-01 | Discharge: 2020-04-01 | Disposition: A | Discharge status: Home / Self Care | Payer: Medicare HMO | Attending: Emergency Medicine | Admitting: Emergency Medicine

## 2020-04-01 ENCOUNTER — Other Ambulatory Visit: Payer: Self-pay

## 2020-04-01 ENCOUNTER — Encounter (HOSPITAL_COMMUNITY): Payer: Self-pay | Admitting: Emergency Medicine

## 2020-04-01 DIAGNOSIS — G8929 Other chronic pain: Secondary | ICD-10-CM | POA: Insufficient documentation

## 2020-04-01 DIAGNOSIS — M5441 Lumbago with sciatica, right side: Secondary | ICD-10-CM | POA: Diagnosis not present

## 2020-04-01 DIAGNOSIS — Z7982 Long term (current) use of aspirin: Secondary | ICD-10-CM | POA: Diagnosis not present

## 2020-04-01 DIAGNOSIS — M5442 Lumbago with sciatica, left side: Secondary | ICD-10-CM | POA: Insufficient documentation

## 2020-04-01 DIAGNOSIS — M545 Low back pain, unspecified: Secondary | ICD-10-CM | POA: Diagnosis not present

## 2020-04-01 LAB — BASIC METABOLIC PANEL
Anion gap: 8 (ref 5–15)
BUN: 16 mg/dL (ref 8–23)
CO2: 27 mmol/L (ref 22–32)
Calcium: 9.5 mg/dL (ref 8.9–10.3)
Chloride: 104 mmol/L (ref 98–111)
Creatinine, Ser: 1.26 mg/dL — ABNORMAL HIGH (ref 0.61–1.24)
GFR, Estimated: >60 mL/min (ref >60)
Glucose, Bld: 92 mg/dL (ref 70–99)
Potassium: 4.1 mmol/L (ref 3.5–5.1)
Sodium: 139 mmol/L (ref 135–145)

## 2020-04-01 LAB — CBC
HCT: 45.3 % (ref 39.0–52.0)
Hemoglobin: 14 g/dL (ref 13.0–17.0)
MCH: 26.6 pg (ref 26.0–34.0)
MCHC: 30.9 g/dL (ref 30.0–36.0)
MCV: 86 fL (ref 80.0–100.0)
Platelets: 365 10*3/uL (ref 150–400)
RBC: 5.27 MIL/uL (ref 4.22–5.81)
RDW: 15.3 % (ref 11.5–15.5)
WBC: 12.3 10*3/uL — ABNORMAL HIGH (ref 4.0–10.5)
nRBC: 0 % (ref 0.0–0.2)

## 2020-04-01 MED ORDER — HYDROMORPHONE HCL 1 MG/ML IJ SOLN
1.0000 mg | INTRAMUSCULAR | Status: DC | PRN
  Administered 2020-04-01: 1 mg via INTRAVENOUS
  Filled 2020-04-01: qty 1

## 2020-04-01 MED ORDER — OXYCODONE-ACETAMINOPHEN 5-325 MG PO TABS: 1.0000 | ORAL_TABLET | Freq: Four times a day (QID) | ORAL | 0 refills | Status: DC | PRN
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
## 2020-04-01 NOTE — Discharge Instructions (Signed)
Take the medications as needed for pain.  Follow-up with your spine surgeon as we discussed for further treatment and evaluation.

## 2020-04-01 NOTE — ED Notes (Signed)
Pt ambulated in hallway with a steady gait with with the assistance of a walker. Pt stated "I feel a lot better". Tomi Bamberger, MD has been notified.

## 2020-04-02 ENCOUNTER — Other Ambulatory Visit: Payer: Self-pay | Admitting: Neurosurgery

## 2020-04-10 ENCOUNTER — Emergency Department (HOSPITAL_COMMUNITY)
Admission: EM | Admit: 2020-04-10 | Discharge: 2020-04-10 | Disposition: A | Discharge status: Home / Self Care | Payer: Medicare HMO | Attending: Emergency Medicine | Admitting: Emergency Medicine

## 2020-04-10 ENCOUNTER — Encounter (HOSPITAL_COMMUNITY): Payer: Self-pay | Admitting: Pharmacy Technician

## 2020-04-10 ENCOUNTER — Other Ambulatory Visit: Payer: Self-pay

## 2020-04-10 DIAGNOSIS — I160 Hypertensive urgency: Secondary | ICD-10-CM | POA: Diagnosis not present

## 2020-04-10 DIAGNOSIS — M5441 Lumbago with sciatica, right side: Secondary | ICD-10-CM | POA: Diagnosis not present

## 2020-04-10 DIAGNOSIS — G8929 Other chronic pain: Secondary | ICD-10-CM | POA: Insufficient documentation

## 2020-04-10 DIAGNOSIS — Z7982 Long term (current) use of aspirin: Secondary | ICD-10-CM | POA: Insufficient documentation

## 2020-04-10 DIAGNOSIS — F419 Anxiety disorder, unspecified: Secondary | ICD-10-CM | POA: Diagnosis not present

## 2020-04-10 DIAGNOSIS — R69 Illness, unspecified: Secondary | ICD-10-CM | POA: Diagnosis not present

## 2020-04-10 DIAGNOSIS — I10 Essential (primary) hypertension: Secondary | ICD-10-CM | POA: Diagnosis not present

## 2020-04-10 DIAGNOSIS — Z79899 Other long term (current) drug therapy: Secondary | ICD-10-CM | POA: Insufficient documentation

## 2020-04-10 DIAGNOSIS — G478 Other sleep disorders: Secondary | ICD-10-CM | POA: Insufficient documentation

## 2020-04-10 DIAGNOSIS — Z20822 Contact with and (suspected) exposure to covid-19: Secondary | ICD-10-CM | POA: Diagnosis not present

## 2020-04-10 DIAGNOSIS — M545 Low back pain, unspecified: Secondary | ICD-10-CM | POA: Insufficient documentation

## 2020-04-10 DIAGNOSIS — R000 Tachycardia, unspecified: Secondary | ICD-10-CM | POA: Insufficient documentation

## 2020-04-10 DIAGNOSIS — R0682 Tachypnea, not elsewhere classified: Secondary | ICD-10-CM | POA: Diagnosis not present

## 2020-04-10 DIAGNOSIS — F339 Major depressive disorder, recurrent, unspecified: Secondary | ICD-10-CM | POA: Insufficient documentation

## 2020-04-10 LAB — COMPREHENSIVE METABOLIC PANEL
ALT: 22 U/L (ref 0–44)
AST: 23 U/L (ref 15–41)
Albumin: 4.2 g/dL (ref 3.5–5.0)
Alkaline Phosphatase: 188 U/L — ABNORMAL HIGH (ref 38–126)
Anion gap: 10 (ref 5–15)
BUN: 15 mg/dL (ref 8–23)
CO2: 24 mmol/L (ref 22–32)
Calcium: 9.9 mg/dL (ref 8.9–10.3)
Chloride: 103 mmol/L (ref 98–111)
Creatinine, Ser: 1.17 mg/dL (ref 0.61–1.24)
GFR, Estimated: >60 mL/min (ref >60)
Glucose, Bld: 115 mg/dL — ABNORMAL HIGH (ref 70–99)
Potassium: 4.4 mmol/L (ref 3.5–5.1)
Sodium: 137 mmol/L (ref 135–145)
Total Bilirubin: 0.8 mg/dL (ref 0.3–1.2)
Total Protein: 8.2 g/dL — ABNORMAL HIGH (ref 6.5–8.1)

## 2020-04-10 LAB — CBC WITH DIFFERENTIAL/PLATELET
Abs Immature Granulocytes: 0.1 10*3/uL — ABNORMAL HIGH (ref 0.00–0.07)
Basophils Absolute: 0 10*3/uL (ref 0.0–0.1)
Basophils Relative: 0 %
Eosinophils Absolute: 0 10*3/uL (ref 0.0–0.5)
Eosinophils Relative: 0 %
HCT: 44.3 % (ref 39.0–52.0)
Hemoglobin: 14.9 g/dL (ref 13.0–17.0)
Immature Granulocytes: 1 %
Lymphocytes Relative: 21 %
Lymphs Abs: 2.9 10*3/uL (ref 0.7–4.0)
MCH: 28 pg (ref 26.0–34.0)
MCHC: 33.6 g/dL (ref 30.0–36.0)
MCV: 83.1 fL (ref 80.0–100.0)
Monocytes Absolute: 1 10*3/uL (ref 0.1–1.0)
Monocytes Relative: 7 %
Neutro Abs: 9.6 10*3/uL — ABNORMAL HIGH (ref 1.7–7.7)
Neutrophils Relative %: 71 %
Platelets: 454 10*3/uL — ABNORMAL HIGH (ref 150–400)
RBC: 5.33 MIL/uL (ref 4.22–5.81)
RDW: 15.3 % (ref 11.5–15.5)
WBC: 13.6 10*3/uL — ABNORMAL HIGH (ref 4.0–10.5)
nRBC: 0 % (ref 0.0–0.2)

## 2020-04-10 LAB — RESP PANEL BY RT-PCR (FLU A&B, COVID) ARPGX2
Influenza A by PCR: NEGATIVE
Influenza B by PCR: NEGATIVE
SARS Coronavirus 2 by RT PCR: NEGATIVE

## 2020-04-10 LAB — RAPID URINE DRUG SCREEN, HOSP PERFORMED
Amphetamines: NOT DETECTED
Barbiturates: NOT DETECTED
Benzodiazepines: POSITIVE — AB
Cocaine: NOT DETECTED
Opiates: NOT DETECTED
Tetrahydrocannabinol: POSITIVE — AB

## 2020-04-10 LAB — ETHANOL: Alcohol, Ethyl (B): <10 mg/dL (ref <10)

## 2020-04-10 LAB — SALICYLATE LEVEL: Salicylate Lvl: <7 mg/dL — ABNORMAL LOW (ref 7.0–30.0)

## 2020-04-10 LAB — ACETAMINOPHEN LEVEL: Acetaminophen (Tylenol), Serum: <10 ug/mL — ABNORMAL LOW (ref 10–30)

## 2020-04-10 MED ORDER — CLONAZEPAM 0.5 MG PO TABS: 1.0000 mg | ORAL_TABLET | Freq: Three times a day (TID) | ORAL | Status: DC | PRN

## 2020-04-10 MED ORDER — CLONAZEPAM 0.5 MG PO TABS: 0.5000 mg | ORAL_TABLET | Freq: Two times a day (BID) | ORAL | 0 refills | Status: DC | PRN

## 2020-04-10 MED ORDER — OXYCODONE HCL 10 MG PO TABS
10.0000 mg | ORAL_TABLET | Freq: Four times a day (QID) | ORAL | 0 refills | Status: DC | PRN
Start: 2020-04-10 — End: 2021-07-26

## 2020-04-10 MED ORDER — ONDANSETRON HCL 4 MG PO TABS: 4.0000 mg | ORAL_TABLET | Freq: Three times a day (TID) | ORAL | Status: DC | PRN

## 2020-04-10 MED ORDER — PAROXETINE HCL 20 MG PO TABS
60.0000 mg | ORAL_TABLET | Freq: Every day | ORAL | Status: DC
  Administered 2020-04-10: 60 mg via ORAL
  Filled 2020-04-10: qty 3

## 2020-04-10 MED ORDER — OXYCODONE-ACETAMINOPHEN 5-325 MG PO TABS: 1.0000 | ORAL_TABLET | Freq: Four times a day (QID) | ORAL | Status: DC | PRN

## 2020-04-10 MED ORDER — HYDROCODONE-ACETAMINOPHEN 5-325 MG PO TABS
2.0000 | ORAL_TABLET | Freq: Once | ORAL | Status: AC
  Administered 2020-04-10: 2 via ORAL
  Filled 2020-04-10: qty 2

## 2020-04-10 MED ORDER — LORAZEPAM 1 MG PO TABS
1.0000 mg | ORAL_TABLET | Freq: Once | ORAL | Status: AC
  Administered 2020-04-10: 1 mg via ORAL
  Filled 2020-04-10: qty 1

## 2020-04-10 MED ORDER — OXYCODONE-ACETAMINOPHEN 5-325 MG PO TABS
1.0000 | ORAL_TABLET | Freq: Once | ORAL | Status: AC
  Administered 2020-04-10: 1 via ORAL
  Filled 2020-04-10: qty 1
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
## 2020-04-10 NOTE — Discharge Instructions (Signed)
I have written you for a very short course of your anxiety and pain medicine.  It is important you touch base with your providers at [facility] as we cannot for any additional medication.  I have given you additional psychiatric follow-up.  Behavioral health urgent care also [facility].  Return for any worse symptoms
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
## 2020-04-10 NOTE — Care Management (Signed)
ED RNCM met with patient to explain that his prescription would be sent to his pharmacy in [facility] in [place]. Patient continues to state his wife has left with his and clothing. Patient is in scrubs, and TOC staff informed patient we would arrange [facility] services to get patient home. Transportation waiver signed and uber will contact RN when they arrive. No further ED TOC needs identified.  

## 2020-04-10 NOTE — ED Provider Notes (Signed)
Patient seen by previous provider.  Patient with history of chronic back pain scheduled for surgery in 2 weeks, anxiety.  Unfortunately has had increased anxiety and has taken more Klonopin than he has supposed to daily.  Not reach out to a psychiatrist for his increased anxiety and benzodiazepine use.  He denies any SI, HI, AVH.  Also complains of chronic back pain.  Denies any changes to his pain.  No history IV drug use, bowel or bladder incontinence, saddle paresthesia, numbness or tingling.  He is ambulatory in ED without difficulty  Patient medically cleared.  Plan on F/U with TTS Physical Exam  BP (!) 189/110   Pulse 93   Temp 98.1 F (36.7 C) (Oral)   Resp 18   SpO2 100%   Physical Exam Vitals and nursing note reviewed.  Constitutional:      General: He is not in acute distress.    Appearance: He is well-developed. He is not diaphoretic.  HENT:     Head: Atraumatic.  Eyes:     Pupils: Pupils are equal, round, and reactive to light.  Cardiovascular:     Rate and Rhythm: Normal rate and regular rhythm.  Pulmonary:     Effort: Pulmonary effort is normal. No respiratory distress.  Abdominal:     General: There is no distension.     Palpations: Abdomen is soft.  Musculoskeletal:        General: Normal range of motion.     Cervical back: Normal range of motion and neck supple.  Skin:    General: Skin is warm and dry.  Neurological:     Mental Status: He is alert.     Comments:  ambulatory without difficulty  Psychiatric:     Comments: Denies SI, HI, AVH    ED Course/Procedures     Procedures Labs Reviewed  COMPREHENSIVE METABOLIC PANEL - Abnormal; Notable for the following components:      Result Value   Glucose, Bld 115 (*)    Total Protein 8.2 (*)    Alkaline Phosphatase 188 (*)    All other components within normal limits  RAPID URINE DRUG SCREEN, HOSP PERFORMED - Abnormal; Notable for the following components:   Benzodiazepines POSITIVE (*)     Tetrahydrocannabinol POSITIVE (*)    All other components within normal limits  CBC WITH DIFFERENTIAL/PLATELET - Abnormal; Notable for the following components:   WBC 13.6 (*)    Platelets 454 (*)    Neutro Abs 9.6 (*)    Abs Immature Granulocytes 0.10 (*)    All other components within normal limits  SALICYLATE LEVEL - Abnormal; Notable for the following components:   Salicylate Lvl <5.3 (*)    All other components within normal limits  ACETAMINOPHEN LEVEL - Abnormal; Notable for the following components:   Acetaminophen (Tylenol), Serum <10 (*)    All other components within normal limits  RESP PANEL BY RT-PCR (FLU A&B, COVID) ARPGX2  ETHANOL  No results found. MDM  TTS has cleared patient>>> patient given outpatient resources.  He continues to deny any SI, HI, AVH.  With regards to his chronic pain medicine and chronic benzodiazepine use.  He has no new focal neurologic deficits to suggest acute cord impingement or cauda equina.  Will do number 5 pills until he can get in contact with his scheduling providers.  Rx sent to his preferred pharmacy so he can pick these up tonight  He will return for any worsening symptoms.  Patient states his wife  went home and she is no longer picking up the phone to come and pick him up.  I have contacted with transition of care to get him transportation back to his house.  The patient has been appropriately medically screened and/or stabilized in the ED. I have low suspicion for any other emergent medical condition which would require further screening, evaluation or treatment in the ED or require inpatient management.  Patient is hemodynamically stable and in no acute distress.  Patient able to ambulate in department prior to ED.  Evaluation does not show acute pathology that would require ongoing or additional emergent interventions while in the emergency department or further inpatient treatment.  I have discussed the diagnosis with the patient and  answered all questions.  Pain is been managed while in the emergency department and patient has no further complaints prior to discharge.  Patient is comfortable with plan discussed in room and is stable for discharge at this time.  I have discussed strict return precautions for returning to the emergency department.  Patient was encouraged to follow-up with PCP/specialist refer to at discharge.       [name] A, PA-C 04/10/20 2129    Sherwood Gambler, MD 04/10/20 [phone]
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
## 2020-04-10 NOTE — BH Assessment (Signed)
Disposition: Per Leandro Reasoner, NP pt does not meet inpatient criteria and can be discharged with outpatient psychiatric resources and pt advised to follow up with psychiatrist and neurologist ASAP (med prescribers)  Jeanmarie Plant, MSW, LCSW Outpatient Therapist/Triage Specialist

## 2020-04-14 ENCOUNTER — Ambulatory Visit (HOSPITAL_COMMUNITY)
Admission: RE | Admit: 2020-04-14 | Discharge: 2020-04-14 | Disposition: A | Discharge status: Home / Self Care | Payer: Medicare HMO | Source: Home / Self Care | Attending: Psychiatry | Admitting: Psychiatry

## 2020-04-14 ENCOUNTER — Emergency Department (HOSPITAL_COMMUNITY): Payer: Medicare HMO

## 2020-04-14 ENCOUNTER — Emergency Department (HOSPITAL_COMMUNITY)
Admission: EM | Admit: 2020-04-14 | Discharge: 2020-04-15 | Disposition: A | Discharge status: Home / Self Care | Payer: Medicare HMO | Attending: Emergency Medicine | Admitting: Emergency Medicine

## 2020-04-14 ENCOUNTER — Other Ambulatory Visit: Payer: Self-pay

## 2020-04-14 ENCOUNTER — Encounter (HOSPITAL_COMMUNITY): Payer: Self-pay

## 2020-04-14 DIAGNOSIS — F339 Major depressive disorder, recurrent, unspecified: Secondary | ICD-10-CM | POA: Diagnosis present

## 2020-04-14 DIAGNOSIS — Z72820 Sleep deprivation: Secondary | ICD-10-CM | POA: Insufficient documentation

## 2020-04-14 DIAGNOSIS — F419 Anxiety disorder, unspecified: Secondary | ICD-10-CM

## 2020-04-14 DIAGNOSIS — F332 Major depressive disorder, recurrent severe without psychotic features: Secondary | ICD-10-CM | POA: Insufficient documentation

## 2020-04-14 DIAGNOSIS — M549 Dorsalgia, unspecified: Secondary | ICD-10-CM | POA: Insufficient documentation

## 2020-04-14 DIAGNOSIS — R0602 Shortness of breath: Secondary | ICD-10-CM | POA: Diagnosis not present

## 2020-04-14 DIAGNOSIS — F439 Reaction to severe stress, unspecified: Secondary | ICD-10-CM | POA: Insufficient documentation

## 2020-04-14 DIAGNOSIS — Z9114 Patient's other noncompliance with medication regimen: Secondary | ICD-10-CM | POA: Insufficient documentation

## 2020-04-14 DIAGNOSIS — Z20822 Contact with and (suspected) exposure to covid-19: Secondary | ICD-10-CM | POA: Insufficient documentation

## 2020-04-14 DIAGNOSIS — M545 Low back pain, unspecified: Secondary | ICD-10-CM | POA: Diagnosis not present

## 2020-04-14 DIAGNOSIS — G8929 Other chronic pain: Secondary | ICD-10-CM | POA: Diagnosis not present

## 2020-04-14 DIAGNOSIS — I10 Essential (primary) hypertension: Secondary | ICD-10-CM | POA: Diagnosis not present

## 2020-04-14 DIAGNOSIS — Z7982 Long term (current) use of aspirin: Secondary | ICD-10-CM | POA: Insufficient documentation

## 2020-04-14 DIAGNOSIS — R69 Illness, unspecified: Secondary | ICD-10-CM | POA: Diagnosis not present

## 2020-04-14 LAB — COMPREHENSIVE METABOLIC PANEL
ALT: 16 U/L (ref 0–44)
AST: 15 U/L (ref 15–41)
Albumin: 4.8 g/dL (ref 3.5–5.0)
Alkaline Phosphatase: 176 U/L — ABNORMAL HIGH (ref 38–126)
Anion gap: 11 (ref 5–15)
BUN: 22 mg/dL (ref 8–23)
CO2: 23 mmol/L (ref 22–32)
Calcium: 9.4 mg/dL (ref 8.9–10.3)
Chloride: 104 mmol/L (ref 98–111)
Creatinine, Ser: 1.18 mg/dL (ref 0.61–1.24)
GFR, Estimated: >60 mL/min (ref >60)
Glucose, Bld: 103 mg/dL — ABNORMAL HIGH (ref 70–99)
Potassium: 3.9 mmol/L (ref 3.5–5.1)
Sodium: 138 mmol/L (ref 135–145)
Total Bilirubin: 1 mg/dL (ref 0.3–1.2)
Total Protein: 8.3 g/dL — ABNORMAL HIGH (ref 6.5–8.1)

## 2020-04-14 LAB — CBC WITH DIFFERENTIAL/PLATELET
Abs Immature Granulocytes: 0.17 10*3/uL — ABNORMAL HIGH (ref 0.00–0.07)
Basophils Absolute: 0.1 10*3/uL (ref 0.0–0.1)
Basophils Relative: 0 %
Eosinophils Absolute: 0.1 10*3/uL (ref 0.0–0.5)
Eosinophils Relative: 1 %
HCT: 45.9 % (ref 39.0–52.0)
Hemoglobin: 14.7 g/dL (ref 13.0–17.0)
Immature Granulocytes: 1 %
Lymphocytes Relative: 26 %
Lymphs Abs: 4.7 10*3/uL — ABNORMAL HIGH (ref 0.7–4.0)
MCH: 27.9 pg (ref 26.0–34.0)
MCHC: 32 g/dL (ref 30.0–36.0)
MCV: 87.1 fL (ref 80.0–100.0)
Monocytes Absolute: 1.4 10*3/uL — ABNORMAL HIGH (ref 0.1–1.0)
Monocytes Relative: 8 %
Neutro Abs: 11.4 10*3/uL — ABNORMAL HIGH (ref 1.7–7.7)
Neutrophils Relative %: 64 %
Platelets: 431 10*3/uL — ABNORMAL HIGH (ref 150–400)
RBC: 5.27 MIL/uL (ref 4.22–5.81)
RDW: 15.3 % (ref 11.5–15.5)
WBC: 17.9 10*3/uL — ABNORMAL HIGH (ref 4.0–10.5)
nRBC: 0 % (ref 0.0–0.2)

## 2020-04-14 LAB — ETHANOL: Alcohol, Ethyl (B): <10 mg/dL (ref <10)

## 2020-04-14 LAB — RESP PANEL BY RT-PCR (FLU A&B, COVID) ARPGX2
Influenza A by PCR: NEGATIVE
Influenza B by PCR: NEGATIVE
SARS Coronavirus 2 by RT PCR: NEGATIVE

## 2020-04-14 LAB — TROPONIN I (HIGH SENSITIVITY): Troponin I (High Sensitivity): 13 ng/L (ref <18)

## 2020-04-14 MED ORDER — PAROXETINE HCL 20 MG PO TABS
60.0000 mg | ORAL_TABLET | Freq: Every day | ORAL | Status: DC
  Administered 2020-04-14: 60 mg via ORAL
  Filled 2020-04-14: qty 3

## 2020-04-14 MED ORDER — ACETAMINOPHEN 325 MG PO TABS: 650.0000 mg | ORAL_TABLET | Freq: Four times a day (QID) | ORAL | Status: DC | PRN

## 2020-04-14 MED ORDER — LISINOPRIL 10 MG PO TABS
10.0000 mg | ORAL_TABLET | Freq: Every day | ORAL | Status: DC
  Administered 2020-04-15: 10 mg via ORAL
  Filled 2020-04-14: qty 1

## 2020-04-14 MED ORDER — ONDANSETRON HCL 4 MG PO TABS: 4.0000 mg | ORAL_TABLET | Freq: Three times a day (TID) | ORAL | Status: DC | PRN

## 2020-04-14 MED ORDER — DOCUSATE SODIUM 100 MG PO CAPS
100.0000 mg | ORAL_CAPSULE | Freq: Two times a day (BID) | ORAL | Status: DC
  Filled 2020-04-14: qty 1

## 2020-04-14 MED ORDER — GABAPENTIN 300 MG PO CAPS
300.0000 mg | ORAL_CAPSULE | Freq: Three times a day (TID) | ORAL | Status: DC
Start: 2020-04-14 — End: 2020-04-15
  Administered 2020-04-14: 300 mg via ORAL
  Filled 2020-04-14: qty 1

## 2020-04-14 MED ORDER — LORAZEPAM 2 MG/ML IJ SOLN
1.0000 mg | Freq: Once | INTRAMUSCULAR | Status: AC
  Administered 2020-04-14: 1 mg via INTRAVENOUS
  Filled 2020-04-14: qty 1

## 2020-04-14 MED ORDER — METHOCARBAMOL 500 MG PO TABS: 500.0000 mg | ORAL_TABLET | Freq: Three times a day (TID) | ORAL | Status: DC | PRN

## 2020-04-14 MED ORDER — ALBUTEROL SULFATE HFA 108 (90 BASE) MCG/ACT IN AERS: 2.0000 | INHALATION_SPRAY | RESPIRATORY_TRACT | Status: DC | PRN

## 2020-04-14 MED ORDER — LISINOPRIL 10 MG PO TABS
10.0000 mg | ORAL_TABLET | Freq: Once | ORAL | Status: AC
  Administered 2020-04-14: 10 mg via ORAL
  Filled 2020-04-14: qty 1

## 2020-04-14 MED ORDER — HYDROXYZINE HCL 25 MG PO TABS: 25.0000 mg | ORAL_TABLET | Freq: Four times a day (QID) | ORAL | Status: DC | PRN

## 2020-04-14 MED ORDER — CLONAZEPAM 0.5 MG PO TABS
0.5000 mg | ORAL_TABLET | Freq: Two times a day (BID) | ORAL | Status: DC | PRN
  Administered 2020-04-15: 0.5 mg via ORAL
  Filled 2020-04-14: qty 1

## 2020-04-14 MED ORDER — MORPHINE SULFATE (PF) 4 MG/ML IV SOLN
4.0000 mg | Freq: Once | INTRAVENOUS | Status: AC
  Administered 2020-04-14: 4 mg via INTRAVENOUS
  Filled 2020-04-14: qty 1
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
## 2020-04-15 ENCOUNTER — Encounter (HOSPITAL_COMMUNITY): Payer: Self-pay | Admitting: Registered Nurse

## 2020-04-15 ENCOUNTER — Other Ambulatory Visit: Payer: Self-pay | Admitting: Neurosurgery

## 2020-04-15 DIAGNOSIS — R69 Illness, unspecified: Secondary | ICD-10-CM | POA: Diagnosis not present

## 2020-04-15 DIAGNOSIS — F339 Major depressive disorder, recurrent, unspecified: Secondary | ICD-10-CM | POA: Diagnosis not present

## 2020-04-15 LAB — URINALYSIS, ROUTINE W REFLEX MICROSCOPIC
Bacteria, UA: NONE SEEN
Bilirubin Urine: NEGATIVE
Glucose, UA: NEGATIVE mg/dL
Ketones, ur: NEGATIVE mg/dL
Leukocytes,Ua: NEGATIVE
Nitrite: NEGATIVE
Protein, ur: 30 mg/dL — AB
Specific Gravity, Urine: 1.028 (ref 1.005–1.030)
pH: 5 (ref 5.0–8.0)

## 2020-04-15 LAB — RAPID URINE DRUG SCREEN, HOSP PERFORMED
Amphetamines: POSITIVE — AB
Barbiturates: NOT DETECTED
Benzodiazepines: NOT DETECTED
Cocaine: NOT DETECTED
Opiates: POSITIVE — AB
Tetrahydrocannabinol: POSITIVE — AB

## 2020-04-15 NOTE — ED Provider Notes (Signed)
  Physical Exam  BP (!) 153/75   Pulse 67   Temp 97.9 F (36.6 C) (Oral)   Resp 18   Ht 6' (1.829 m)   Wt 108.9 kg   SpO2 94%   BMI 32.55 kg/m   Physical Exam  ED Course/Procedures     Procedures  MDM  Patient's been seen by psychiatry and cleared for discharge.       Davonna Belling, MD 04/15/20 1526

## 2020-04-15 NOTE — ED Provider Notes (Signed)
Patient medically cleared and will have a second behavioral health assessment   Milton Ferguson, MD 04/15/20 1121

## 2020-04-15 NOTE — ED Notes (Signed)
Patient is resting comfortably. 

## 2020-04-15 NOTE — ED Notes (Signed)
Patient resting comfortably. Respirations even and unlabored.

## 2020-04-15 NOTE — Consult Note (Signed)
Telepsych Consultation   Reason for Consult:  Worsening anxiety, suicidal ideation Referring Physician:  Drenda Freeze, MD Location of Patient: [facility] ED Location of Provider: Other: [facility]  Patient Identification: Patrick Cruz MRN:  [id number] Principal Diagnosis: Major depressive disorder, recurrent episode with anxious distress ([place]) Diagnosis:  Principal Problem:   Major depressive disorder, recurrent episode with anxious distress ([place])   Total Time spent with patient: 30 minutes  Subjective:   Patrick Cruz is a 59 y.o. male patient admitted to [facility] ED after being brought in by his wife with complaints of worsening anxiety and homicidal thoughts towards his wife.  HPI:  Patrick Cruz, 59 y.o., male patient seen via tele health by this provider, consulted with Dr. Mallie Darting; and chart reviewed on 04/15/20.  On evaluation Patrick Cruz reports patient reports he came to the hospital related to having pain and increasing his anxiety.  Patient states that he was having no suicidal thoughts and only passive homicidal thoughts to his wife.  Reports that he is constantly nagged by his wife and sometimes he just wants to be left alone.  Reports he is scheduled to have back surgery on March 23 and he has been dealing with pain from his back and his knee for a while.  Patient reports he has outpatient psychiatric services for medication management but he is not receiving any therapy and that he is interested.  Patient reports he does smoke marijuana almost daily 2-3 times a day stating it helps with his pain and anxiety.  Patient reports that he lives with his wife. During evaluation Patrick Cruz is sitting up in bed in no acute distress.  He is alert, oriented x 4, calm and cooperative.  His mood is anxious with congruent affect.  He does not appear to be responding to internal/external stimuli or delusional thoughts.  Patient denies suicidal/self-harm/homicidal ideation, psychosis, and  paranoia.  Patient answered question appropriately.  Discussed setting him up for therapy sessions.  Past Psychiatric History: Major Depression and Anxiety  Risk to Self:  No Risk to Others:  No Prior Inpatient Therapy:  Yes Prior Outpatient Therapy:  Yes  Past Medical History:  Past Medical History:  Diagnosis Date  . Anxiety   . Arthritis   . Chronic back pain   . Fracture    left tibia     Past Surgical History:  Procedure Laterality Date  . HIP PINNING,CANNULATED Left 11/01/2019   Procedure: ORIF PROXIMAL FEMORAL NECK FRACTURE;  Surgeon: Shona Needles, MD;  Location: [place];  Service: Orthopedics;  Laterality: Left;  . KNEE ARTHROSCOPY W/ DEBRIDEMENT    . TIBIA IM NAIL INSERTION Left 12/04/2018  . TIBIA IM NAIL INSERTION Right 11/01/2019   Procedure: INTRAMEDULLARY (IM) NAIL TIBIAL;  Surgeon: Shona Needles, MD;  Location: [place];  Service: Orthopedics;  Laterality: Right;  . TIBIA IM NAIL INSERTION Left 12/04/2018   Procedure: INTRAMEDULLARY (IM) NAIL TIBIAL;  Surgeon: Leandrew Koyanagi, MD;  Location: [place];  Service: Orthopedics;  Laterality: Left;   Family History:  Family History  Problem Relation Age of Onset  . Obesity Other   . Heart failure Father    Family Psychiatric  History: None reported Social History:  Social History   Substance and Sexual Activity  Alcohol Use Never     Social History   Substance and Sexual Activity  Drug Use Not Currently    Social History   Socioeconomic History  . Marital  status: Married    Spouse name: Not on file  . Number of children: Not on file  . Years of education: Not on file  . Highest education level: Not on file  Occupational History  . Not on file  Tobacco Use  . Smoking status: Never Smoker  . Smokeless tobacco: Never Used  Vaping Use  . Vaping Use: Never used  Substance and Sexual Activity  . Alcohol use: Never  . Drug use: Not Currently  . Sexual activity: Yes  Other Topics Concern  . Not on file   Social History Narrative   ** Merged History Encounter **       Social Determinants of Health   Financial Resource Strain: Not on file  Food Insecurity: Not on file  Transportation Needs: Not on file  Physical Activity: Not on file  Stress: Not on file  Social Connections: Not on file   Additional Social History:    Allergies:  No Known Allergies  Labs:  Results for orders placed or performed during the hospital encounter of 04/14/20 (from the past 48 hour(s))  CBC with Differential/Platelet     Status: Abnormal   Collection Time: 04/14/20  9:45 PM  Result Value Ref Range   WBC 17.9 (H) 4.0 - 10.5 K/uL   RBC 5.27 4.22 - 5.81 MIL/uL   Hemoglobin 14.7 13.0 - 17.0 g/dL   HCT 45.9 39.0 - 52.0 %   MCV 87.1 80.0 - 100.0 fL   MCH 27.9 26.0 - 34.0 pg   MCHC 32.0 30.0 - 36.0 g/dL   RDW 15.3 11.5 - 15.5 %   Platelets 431 (H) 150 - 400 K/uL   nRBC 0.0 0.0 - 0.2 %   Neutrophils Relative % 64 %   Neutro Abs 11.4 (H) 1.7 - 7.7 K/uL   Lymphocytes Relative 26 %   Lymphs Abs 4.7 (H) 0.7 - 4.0 K/uL   Monocytes Relative 8 %   Monocytes Absolute 1.4 (H) 0.1 - 1.0 K/uL   Eosinophils Relative 1 %   Eosinophils Absolute 0.1 0.0 - 0.5 K/uL   Basophils Relative 0 %   Basophils Absolute 0.1 0.0 - 0.1 K/uL   Immature Granulocytes 1 %   Abs Immature Granulocytes 0.17 (H) 0.00 - 0.07 K/uL   Reactive, Benign Lymphocytes PRESENT     Comment: Performed at [facility], [place] [address] [place],  [address]  Comprehensive metabolic panel     Status: Abnormal   Collection Time: 04/14/20  9:45 PM  Result Value Ref Range   Sodium 138 135 - 145 mmol/L   Potassium 3.9 3.5 - 5.1 mmol/L   Chloride 104 98 - 111 mmol/L   CO2 23 22 - 32 mmol/L   Glucose, Bld 103 (H) 70 - 99 mg/dL    Comment: Glucose reference range applies only to samples taken after fasting for at least 8 hours.   BUN 22 8 - 23 mg/dL   Creatinine, Ser 1.18 0.61 - 1.24 mg/dL   Calcium 9.4 8.9 - 10.3 mg/dL    Total Protein 8.3 (H) 6.5 - 8.1 g/dL   Albumin 4.8 3.5 - 5.0 g/dL   AST 15 15 - 41 U/L   ALT 16 0 - 44 U/L   Alkaline Phosphatase 176 (H) 38 - 126 U/L   Total Bilirubin 1.0 0.3 - 1.2 mg/dL   GFR, Estimated >60 >60 mL/min    Comment: (NOTE) Calculated using the CKD-EPI Creatinine Equation (2021)    Anion gap  11 5 - 15    Comment: Performed at [facility], [place] [address] [place], [place] [address]  Troponin I (High Sensitivity)     Status: None   Collection Time: 04/14/20  9:45 PM  Result Value Ref Range   Troponin I (High Sensitivity) 13 <18 ng/L    Comment: (NOTE) Elevated high sensitivity troponin I (hsTnI) values and significant  changes across serial measurements may suggest ACS but many other  chronic and acute conditions are known to elevate hsTnI results.  Refer to the "Links" section for chest pain algorithms and additional  guidance. Performed at [facility], [place] [address] [place], [place] [address]   Ethanol     Status: None   Collection Time: 04/14/20  9:45 PM  Result Value Ref Range   Alcohol, Ethyl (B) <10 <10 mg/dL    Comment: (NOTE) Lowest detectable limit for serum alcohol is 10 mg/dL.  For medical purposes only. Performed at [facility], [place] [address], [place], [place] [address]   Resp Panel by RT-PCR (Flu A&B, Covid) Nasopharyngeal Swab     Status: None   Collection Time: 04/14/20 10:11 PM   Specimen: Nasopharyngeal Swab; Nasopharyngeal(NP) swabs in vial transport medium  Result Value Ref Range   SARS Coronavirus 2 by RT PCR NEGATIVE NEGATIVE    Comment: (NOTE) SARS-CoV-2 target nucleic acids are NOT DETECTED.  The SARS-CoV-2 RNA is generally detectable in upper respiratory specimens during the acute phase of infection. The lowest concentration of SARS-CoV-2 viral copies this assay can detect is 138 copies/mL. A negative result does not preclude SARS-Cov-2 infection and should not be used  as the sole basis for treatment or other patient management decisions. A negative result may occur with  improper specimen collection/handling, submission of specimen other than nasopharyngeal swab, presence of viral mutation(s) within the areas targeted by this assay, and inadequate number of viral copies(<138 copies/mL). A negative result must be combined with clinical observations, patient history, and epidemiological information. The expected result is Negative.  Fact Sheet for Patients:  EntrepreneurPulse.com.au  Fact Sheet for Healthcare Providers:  IncredibleEmployment.be  This test is no t yet approved or cleared by the [place] FDA and  has been authorized for detection and/or diagnosis of SARS-CoV-2 by FDA under an Emergency Use Authorization (EUA). This EUA will remain  in effect (meaning this test can be used) for the duration of the COVID-19 declaration under Section 564(b)(1) of the Act, 21 U.S.C.section 360bbb-3(b)(1), unless the authorization is terminated  or revoked sooner.       Influenza A by PCR NEGATIVE NEGATIVE   Influenza B by PCR NEGATIVE NEGATIVE    Comment: (NOTE) The Xpert Xpress SARS-CoV-2/FLU/RSV plus assay is intended as an aid in the diagnosis of influenza from Nasopharyngeal swab specimens and should not be used as a sole basis for treatment. Nasal washings and aspirates are unacceptable for Xpert Xpress SARS-CoV-2/FLU/RSV testing.  Fact Sheet for Patients: EntrepreneurPulse.com.au  Fact Sheet for Healthcare Providers: IncredibleEmployment.be  This test is not yet approved or cleared by the [place] FDA and has been authorized for detection and/or diagnosis of SARS-CoV-2 by FDA under an Emergency Use Authorization (EUA). This EUA will remain in effect (meaning this test can be used) for the duration of the COVID-19 declaration under Section 564(b)(1) of the Act,  21 U.S.C. section 360bbb-3(b)(1), unless the authorization is terminated or revoked.  Performed at [facility], [place] [address] [place], [place]  [address]   Rapid urine drug screen (hospital performed)     Status: Abnormal   Collection Time: 04/14/20 11:28 PM  Result Value Ref Range   Opiates POSITIVE (A) NONE DETECTED   Cocaine NONE DETECTED NONE DETECTED   Benzodiazepines NONE DETECTED NONE DETECTED   Amphetamines POSITIVE (A) NONE DETECTED   Tetrahydrocannabinol POSITIVE (A) NONE DETECTED   Barbiturates NONE DETECTED NONE DETECTED    Comment: (NOTE) DRUG SCREEN FOR MEDICAL PURPOSES ONLY.  IF CONFIRMATION IS NEEDED FOR ANY PURPOSE, NOTIFY LAB WITHIN 5 DAYS.  LOWEST DETECTABLE LIMITS FOR URINE DRUG SCREEN Drug Class                     Cutoff (ng/mL) Amphetamine and metabolites    1000 Barbiturate and metabolites    200 Benzodiazepine                 161 Tricyclics and metabolites     300 Opiates and metabolites        300 Cocaine and metabolites        300 THC                            50 Performed at [facility], [place] [address], [place], [place] [address]   Urinalysis, Routine w reflex microscopic Urine, Clean Catch     Status: Abnormal   Collection Time: 04/14/20 11:28 PM  Result Value Ref Range   Color, Urine YELLOW YELLOW   APPearance CLEAR CLEAR   Specific Gravity, Urine 1.028 1.005 - 1.030   pH 5.0 5.0 - 8.0   Glucose, UA NEGATIVE NEGATIVE mg/dL   Hgb urine dipstick SMALL (A) NEGATIVE   Bilirubin Urine NEGATIVE NEGATIVE   Ketones, ur NEGATIVE NEGATIVE mg/dL   Protein, ur 30 (A) NEGATIVE mg/dL   Nitrite NEGATIVE NEGATIVE   Leukocytes,Ua NEGATIVE NEGATIVE   RBC / HPF 0-5 0 - 5 RBC/hpf   WBC, UA 0-5 0 - 5 WBC/hpf   Bacteria, UA NONE SEEN NONE SEEN   Squamous Epithelial / LPF 0-5 0 - 5   Mucus PRESENT    Hyaline Casts, UA PRESENT     Comment: Performed at [facility], [place] [address]  [place], [place] [address]    Medications:  Current Facility-Administered Medications  Medication Dose Route Frequency Provider Last Rate Last Admin  . acetaminophen (TYLENOL) tablet 650 mg  650 mg Oral Q6H PRN Drenda Freeze, MD      . albuterol (VENTOLIN HFA) 108 (90 Base) MCG/ACT inhaler 2 puff  2 puff Inhalation Q2H PRN Drenda Freeze, MD      . clonazePAM [facility] [place]) tablet 0.5 mg  0.5 mg Oral BID PRN Drenda Freeze, MD      . docusate sodium (COLACE) capsule 100 mg  100 mg Oral BID Drenda Freeze, MD      . hydrOXYzine (ATARAX/VISTARIL) tablet 25 mg  25 mg Oral Q6H PRN Drenda Freeze, MD      . lisinopril (ZESTRIL) tablet 10 mg  10 mg Oral Daily Drenda Freeze, MD   10 mg at 04/15/20 1206  . methocarbamol (ROBAXIN) tablet 500 mg  500 mg Oral TID PRN Drenda Freeze, MD      . ondansetron [facility]) tablet 4 mg  4 mg Oral Q8H PRN Drenda Freeze, MD      . PARoxetine (PAXIL) tablet 60 mg  60 mg  Oral QHS Drenda Freeze, MD   60 mg at 04/14/20 2345   Current Outpatient Medications  Medication Sig Dispense Refill  . amphetamine-dextroamphetamine (ADDERALL XR) 30 MG 24 hr capsule Take 30 mg by mouth daily.    . clonazePAM (KLONOPIN) 1 MG tablet Take 1 mg by mouth in the morning, at noon, and at bedtime.    . diphenhydrAMINE (BENADRYL) 25 mg capsule Take 50 mg by mouth at bedtime as needed for sleep.    Marland Kitchen oxyCODONE 10 MG TABS Take 1 tablet (10 mg total) by mouth every 6 (six) hours as needed for severe pain. 5 tablet 0  . PARoxetine (PAXIL) 30 MG tablet Take 60 mg by mouth at bedtime.    . polyvinyl alcohol (LIQUIFILM TEARS) 1.4 % ophthalmic solution Place 1 drop into both eyes as needed for dry eyes.    Marland Kitchen acetaminophen (TYLENOL) 500 MG tablet Take 2 tablets (1,000 mg total) by mouth 3 (three) times daily. (Patient not taking: Reported on 04/15/2020) 30 tablet 0  . aspirin EC 81 MG EC tablet Take 1 tablet (81 mg total) by mouth 2 (two) times daily. (Patient not  taking: Reported on 04/15/2020) 84 tablet 0  . bacitracin ointment Apply topically 2 (two) times daily. (Patient not taking: Reported on 04/15/2020) 120 g 0  . calcium-vitamin D (OSCAL 500/200 D-3) 500-200 MG-UNIT tablet Take 1 tablet by mouth 3 (three) times daily. (Patient not taking: No sig reported) 126 tablet 3  . cholecalciferol (VITAMIN D) 25 MCG tablet Take 2 tablets (2,000 Units total) by mouth 2 (two) times daily. (Patient not taking: No sig reported)    . methocarbamol (ROBAXIN) 500 MG tablet Take 2 tablets (1,000 mg total) by mouth every 8 (eight) hours as needed for muscle spasms. (Patient not taking: No sig reported) 45 tablet   . oxyCODONE-acetaminophen (PERCOCET/ROXICET) 5-325 MG tablet Take 1 tablet by mouth every 6 (six) hours as needed for up to 12 doses for severe pain. (Patient not taking: Reported on 04/15/2020) 12 tablet 0  . Zinc Sulfate 220 (50 Zn) MG TABS Take once daily x 6 weeks (Patient not taking: Reported on 04/15/2020) 42 tablet 0    Musculoskeletal: Strength & Muscle Tone: within normal limits Gait & Station: normal Patient leans: N/A  Psychiatric Specialty Exam: Physical Exam Vitals and nursing note reviewed. Chaperone present: Sitter at bedside.  Constitutional:      General: He is not in acute distress.    Appearance: Normal appearance. He is not ill-appearing.  Cardiovascular:     Rate and Rhythm: Normal rate.  Pulmonary:     Effort: Pulmonary effort is normal.  Musculoskeletal:     Cervical back: Normal range of motion.  Neurological:     Mental Status: He is alert and oriented to person, place, and time.  Psychiatric:        Attention and Perception: Attention and perception normal. He does not perceive auditory or visual hallucinations.        Mood and Affect: Affect normal. Mood is anxious.        Speech: Speech normal.        Behavior: Behavior normal. Behavior is cooperative.        Thought Content: Thought content normal. Thought content is not  paranoid or delusional. Thought content does not include homicidal or suicidal ideation.        Cognition and Memory: Cognition and memory normal.        Judgment: Judgment normal.  Review of Systems  Constitutional: Negative.   HENT: Negative.   Eyes: Negative.   Respiratory: Negative.   Cardiovascular: Negative.   Gastrointestinal: Negative.   Genitourinary: Negative.   Musculoskeletal: Positive for arthralgias, back pain and joint swelling.  Skin: Negative.   Neurological: Negative.   Hematological: Negative.   Psychiatric/Behavioral: Positive for confusion and dysphoric mood. Negative for behavioral problems, hallucinations and self-injury. Sleep disturbance: Sometimes related to pain. The patient is nervous/anxious.        Patient reporting his biggest stressors is his pain and his wife's nagging.  States he has no intent or plan to hurt or harm his wife; just thoughts when she won't shut up and leave him alone.     Blood pressure (!) 153/75, pulse 67, temperature 97.9 F (36.6 C), temperature source Oral, resp. rate 18, height 6' (1.829 m), weight 108.9 kg, SpO2 94 %.Body mass index is 32.55 kg/m.  General Appearance: Casual  Eye Contact:  Good  Speech:  Clear and Coherent  Volume:  Normal  Mood:  Anxious  Affect:  Congruent  Thought Process:  Coherent, Goal Directed and Descriptions of Associations: Intact  Orientation:  Full (Time, Place, and Person)  Thought Content:  WDL  Suicidal Thoughts:  No  Homicidal Thoughts:  No  Memory:  Immediate;   Good Recent;   Good  Judgement:  Intact  Insight:  Present  Psychomotor Activity:  Normal  Concentration:  Concentration: Good and Attention Span: Good  Recall:  Good  Fund of Knowledge:  Good  Language:  Good  Akathisia:  No  Handed:  Right  AIMS (if indicated):     Assets:  Communication Skills Desire for Improvement Financial Resources/Insurance Housing Resilience Social Support  ADL's:  Intact  Cognition:  WNL   Sleep:        Treatment Plan Summary: Plan Psychiatrically clear; Social work will set up follow up for outpatient psychiatric services  Disposition:  Psychiatrically Cleared No evidence of imminent risk to self or others at present.   Patient does not meet criteria for psychiatric inpatient admission. Supportive therapy provided about ongoing stressors. Discussed crisis plan, support from social network, calling 911, coming to the Emergency Department, and calling Suicide Hotline.  This service was provided via telemedicine using a 2-way, interactive audio and video technology.  Names of all persons participating in this telemedicine service and their role in this encounter. Name: Earleen Newport Role: NP  Name: Dr. Mallie Darting Role: Psychiatrist  Name: Patrick Cruz Role: Patient  Name: Dr. Roderic Palau Role: Sent a secure message informing:  Patient seen and psychiatrically cleared.  Social work arranging outpatient psychiatric service follow up.     Hermela Hardt, NP 04/15/2020 1:51 PM

## 2020-04-15 NOTE — ED Notes (Signed)
Breakfast tray ordered at this time.

## 2020-04-15 NOTE — Progress Notes (Signed)
TOC CM referral to assist with PCP, provided on AVS, [place]. Wife to call and schedule an appt to establish with PCP. [place], [place] ED TOC CM [phone]

## 2020-04-22 ENCOUNTER — Encounter (HOSPITAL_COMMUNITY): Payer: Self-pay

## 2020-04-22 ENCOUNTER — Other Ambulatory Visit (HOSPITAL_COMMUNITY): Payer: Medicare HMO

## 2020-04-22 ENCOUNTER — Other Ambulatory Visit: Payer: Self-pay

## 2020-04-22 NOTE — Progress Notes (Signed)
PCP - denies Cardiologist - denies  Chest x-ray - 04/14/20 EKG - 04/15/20 Stress Test -  ECHO -  Cardiac Cath -    COVID TEST- 04/22/20  Anesthesia review: n/a  -------------  SDW INSTRUCTIONS:  Your procedure is scheduled on 04/23/20. Please report to [facility] Main Entrance "A" at 10:20 A.M., and check in at the Admitting office. Call this number if you have problems the morning of surgery: [phone]   Remember: Do not eat or drink after midnight the night before your surgery   Medications to take morning of surgery with a sip of water include: clonazePAM (KLONOPIN)  As of today, STOP taking any Aspirin (unless otherwise instructed by your surgeon), Aleve, Naproxen, Ibuprofen, Motrin, Advil, Goody's, BC's, all herbal medications, fish oil, and all vitamins.    The Morning of Surgery Do not wear jewelry Do not wear lotions, powders, colognes, or deodorant Do not shave 48 hours prior to surgery.   Men may shave face and neck. Do not bring valuables to the hospital. [facility] is not responsible for any belongings or valuables. If you are a smoker, DO NOT Smoke 24 hours prior to surgery If you wear a CPAP at night please bring your mask the morning of surgery  Remember that you must have someone to transport you home after your surgery, and remain with you for 24 hours if you are discharged the same day. Please bring cases for contacts, glasses, hearing aids, dentures or bridgework because it cannot be worn into surgery.   Patients discharged the day of surgery will not be allowed to drive home.   Please shower the NIGHT BEFORE SURGERY and the MORNING OF SURGERY with DIAL Soap. Wear comfortable clothes the morning of surgery. Oral Hygiene is also important to reduce your risk of infection.  Remember - BRUSH YOUR TEETH THE MORNING OF SURGERY WITH YOUR REGULAR TOOTHPASTE  Patient denies shortness of breath, fever, cough and chest pain.

## 2020-04-23 ENCOUNTER — Inpatient Hospital Stay (HOSPITAL_COMMUNITY): Admission: RE | Admit: 2020-04-23 | Payer: Medicare HMO | Source: Home / Self Care

## 2020-04-23 SURGERY — MINIMALLY INVASIVE (MIS) TRANSFORAMINAL LUMBAR INTERBODY FUSION (TLIF) 1 LEVEL
Anesthesia: General | Laterality: Right

## 2020-04-28 DIAGNOSIS — S82201D Unspecified fracture of shaft of right tibia, subsequent encounter for closed fracture with routine healing: Secondary | ICD-10-CM | POA: Diagnosis not present

## 2020-04-28 DIAGNOSIS — S72142D Displaced intertrochanteric fracture of left femur, subsequent encounter for closed fracture with routine healing: Secondary | ICD-10-CM | POA: Diagnosis not present

## 2020-07-23 DIAGNOSIS — R69 Illness, unspecified: Secondary | ICD-10-CM | POA: Diagnosis not present

## 2020-10-15 DIAGNOSIS — F3340 Major depressive disorder, recurrent, in remission, unspecified: Secondary | ICD-10-CM | POA: Diagnosis not present

## 2020-10-15 DIAGNOSIS — R69 Illness, unspecified: Secondary | ICD-10-CM | POA: Diagnosis not present

## 2020-12-03 DIAGNOSIS — F3340 Major depressive disorder, recurrent, in remission, unspecified: Secondary | ICD-10-CM | POA: Diagnosis not present

## 2020-12-03 DIAGNOSIS — R69 Illness, unspecified: Secondary | ICD-10-CM | POA: Diagnosis not present

## 2021-01-01 DIAGNOSIS — R69 Illness, unspecified: Secondary | ICD-10-CM | POA: Diagnosis not present

## 2021-01-21 DIAGNOSIS — R69 Illness, unspecified: Secondary | ICD-10-CM | POA: Diagnosis not present

## 2021-01-21 DIAGNOSIS — F3340 Major depressive disorder, recurrent, in remission, unspecified: Secondary | ICD-10-CM | POA: Diagnosis not present

## 2021-03-24 DIAGNOSIS — F3340 Major depressive disorder, recurrent, in remission, unspecified: Secondary | ICD-10-CM | POA: Diagnosis not present

## 2021-03-24 DIAGNOSIS — R69 Illness, unspecified: Secondary | ICD-10-CM | POA: Diagnosis not present

## 2021-06-18 IMAGING — CR DG HIP (WITH OR WITHOUT PELVIS) 2-3V*L*
3 series · 3 of 3 positions shown · non-contrast
Comparison: 11/01/2019

CLINICAL DATA: Left hip fracture

EXAM:
DG HIP (WITH OR WITHOUT PELVIS) 2-3V LEFT

[pelvis ap]
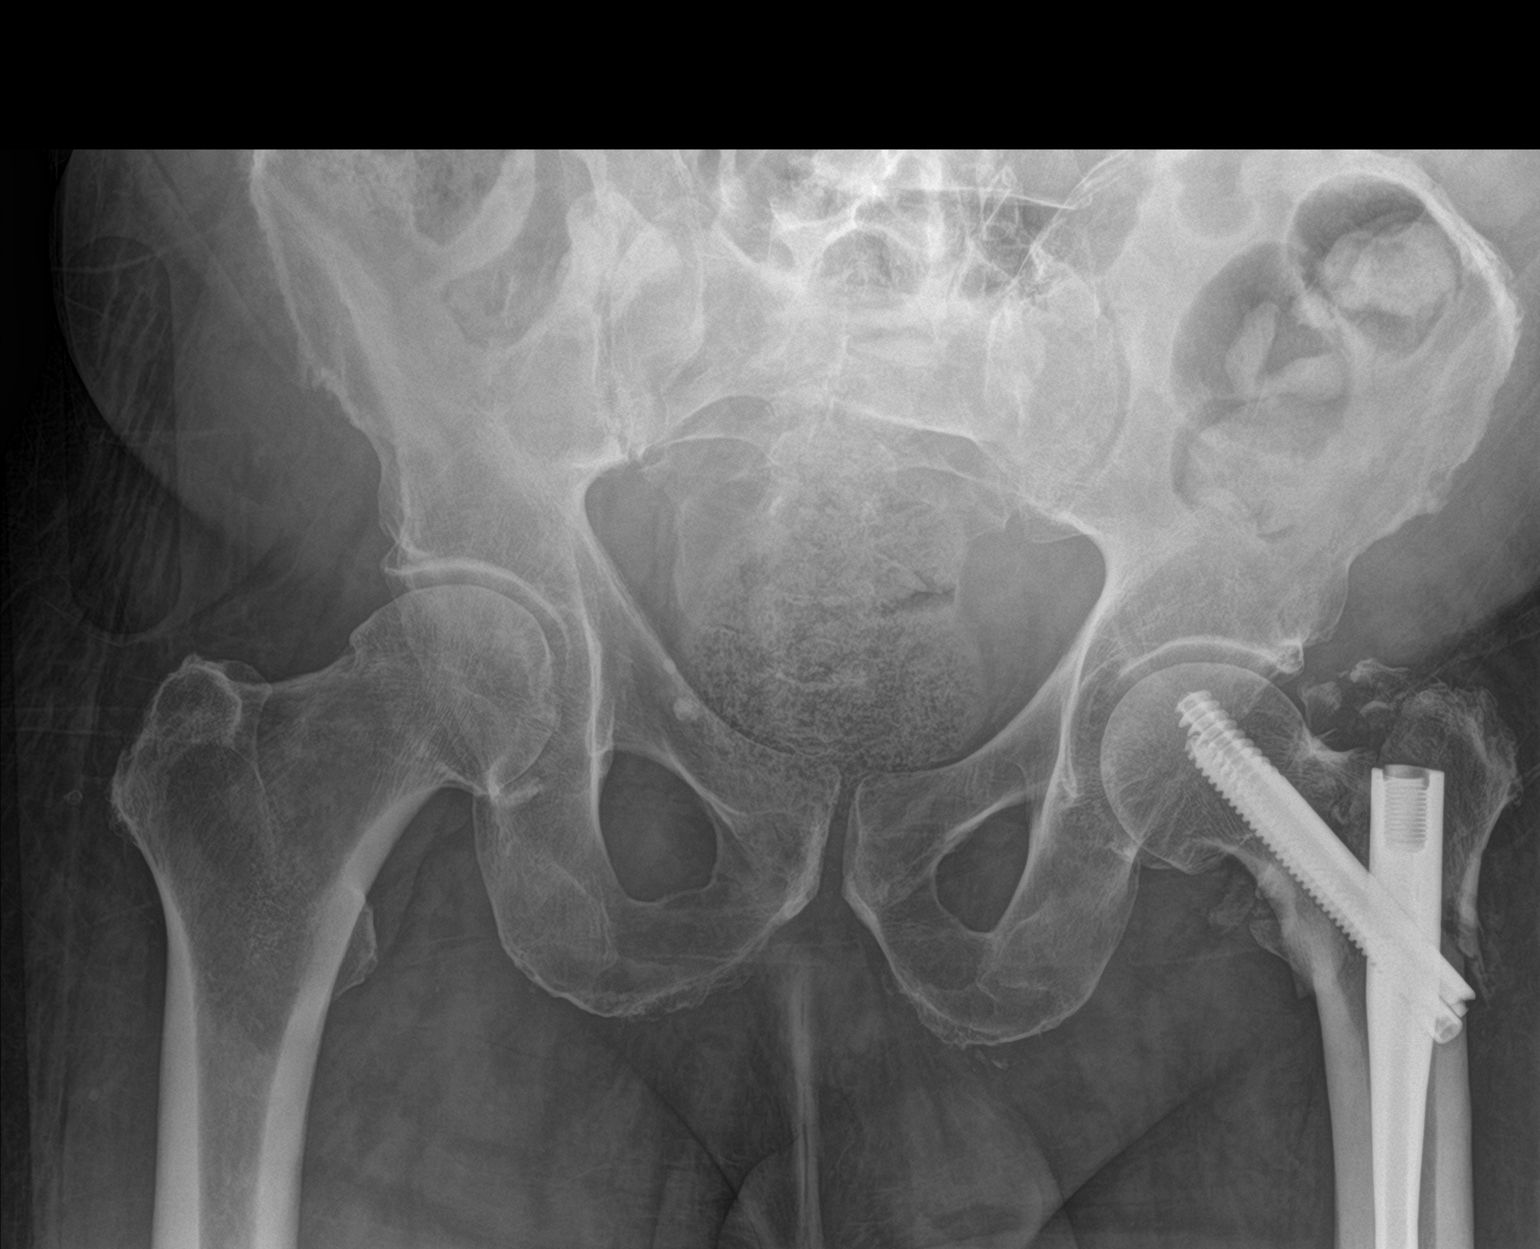

[hip ap]
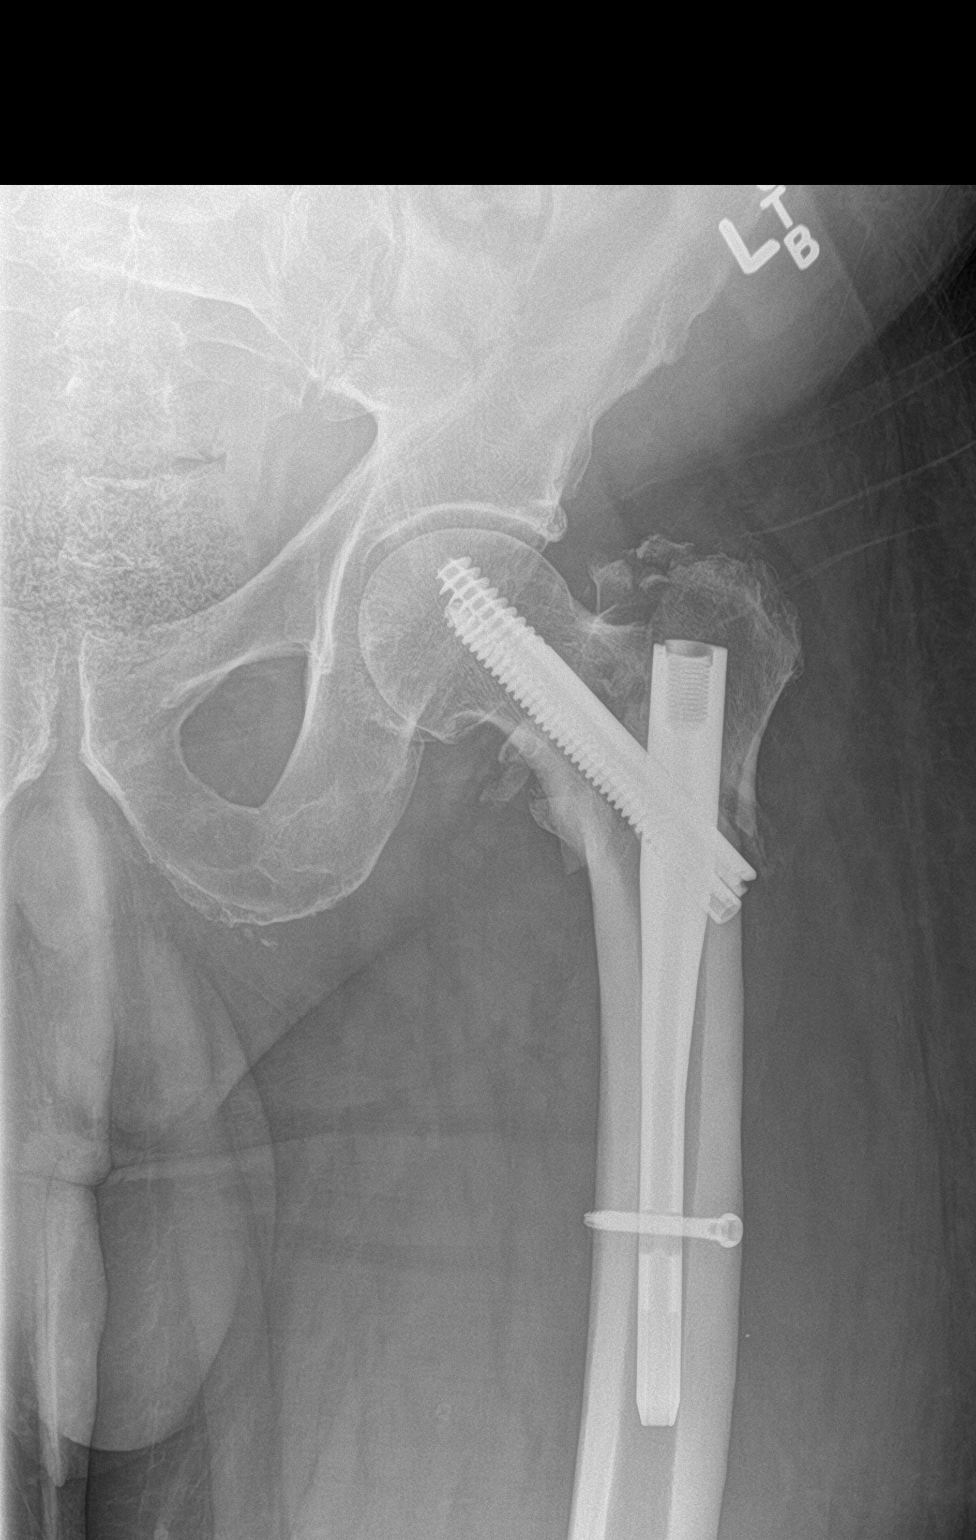

[hip lat]
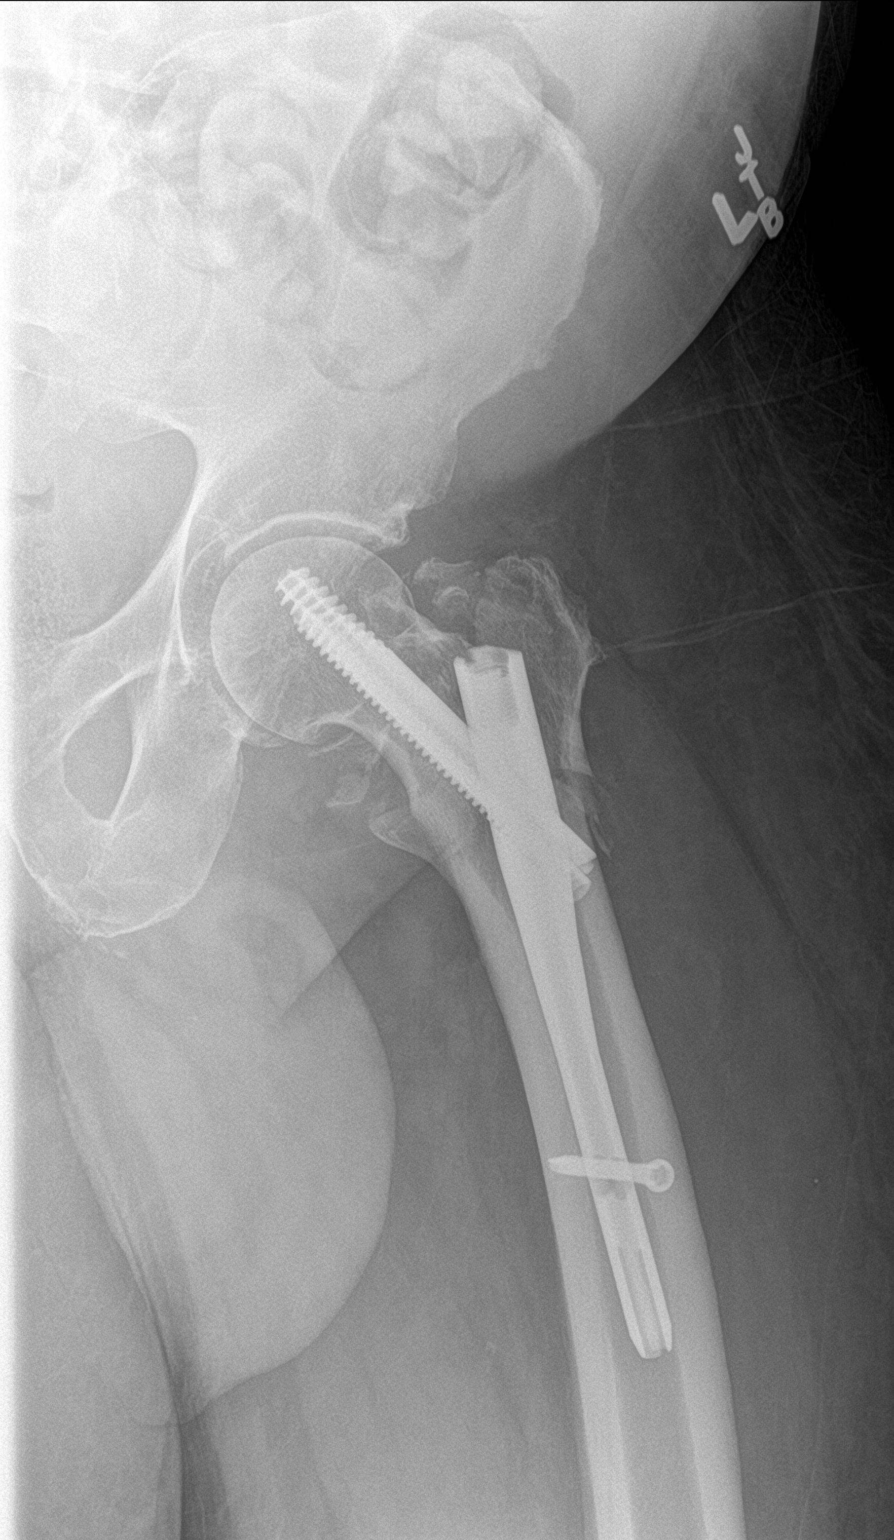

[3 of 3 positions shown; findings below may reference images not displayed]

FINDINGS: Postsurgical changes from left hip ORIF. Hardware remains intact and
unchanged in alignment. No change in alignment of intertrochanteric
fracture. Fracture line remains visible. No new fracture. Left hip
joint alignment is maintained without dislocation.
IMPRESSION: Unchanged alignment of intertrochanteric left hip fracture status
post ORIF.

## 2021-06-22 DIAGNOSIS — F3340 Major depressive disorder, recurrent, in remission, unspecified: Secondary | ICD-10-CM | POA: Diagnosis not present

## 2021-06-22 DIAGNOSIS — R69 Illness, unspecified: Secondary | ICD-10-CM | POA: Diagnosis not present

## 2021-07-23 DIAGNOSIS — F3340 Major depressive disorder, recurrent, in remission, unspecified: Secondary | ICD-10-CM | POA: Diagnosis not present

## 2021-07-23 DIAGNOSIS — R69 Illness, unspecified: Secondary | ICD-10-CM | POA: Diagnosis not present

## 2021-07-26 ENCOUNTER — Other Ambulatory Visit: Payer: Self-pay

## 2021-07-26 ENCOUNTER — Emergency Department (HOSPITAL_COMMUNITY): Payer: Medicare HMO

## 2021-07-26 ENCOUNTER — Encounter (HOSPITAL_COMMUNITY): Payer: Self-pay | Admitting: *Deleted

## 2021-07-26 ENCOUNTER — Emergency Department (HOSPITAL_COMMUNITY)
Admission: EM | Admit: 2021-07-26 | Discharge: 2021-07-26 | Disposition: A | Discharge status: Home / Self Care | Payer: Medicare HMO | Attending: Emergency Medicine | Admitting: Emergency Medicine

## 2021-07-26 DIAGNOSIS — D3502 Benign neoplasm of left adrenal gland: Secondary | ICD-10-CM | POA: Diagnosis not present

## 2021-07-26 DIAGNOSIS — S51012A Laceration without foreign body of left elbow, initial encounter: Secondary | ICD-10-CM | POA: Diagnosis not present

## 2021-07-26 DIAGNOSIS — S60511A Abrasion of right hand, initial encounter: Secondary | ICD-10-CM | POA: Diagnosis not present

## 2021-07-26 DIAGNOSIS — Y92410 Unspecified street and highway as the place of occurrence of the external cause: Secondary | ICD-10-CM | POA: Diagnosis not present

## 2021-07-26 DIAGNOSIS — S51011A Laceration without foreign body of right elbow, initial encounter: Secondary | ICD-10-CM | POA: Diagnosis not present

## 2021-07-26 DIAGNOSIS — R590 Localized enlarged lymph nodes: Secondary | ICD-10-CM | POA: Diagnosis not present

## 2021-07-26 DIAGNOSIS — S0990XA Unspecified injury of head, initial encounter: Secondary | ICD-10-CM | POA: Diagnosis present

## 2021-07-26 DIAGNOSIS — Z9889 Other specified postprocedural states: Secondary | ICD-10-CM | POA: Diagnosis not present

## 2021-07-26 DIAGNOSIS — M1712 Unilateral primary osteoarthritis, left knee: Secondary | ICD-10-CM | POA: Diagnosis not present

## 2021-07-26 DIAGNOSIS — R52 Pain, unspecified: Secondary | ICD-10-CM | POA: Diagnosis not present

## 2021-07-26 DIAGNOSIS — M79662 Pain in left lower leg: Secondary | ICD-10-CM | POA: Diagnosis not present

## 2021-07-26 DIAGNOSIS — Z041 Encounter for examination and observation following transport accident: Secondary | ICD-10-CM | POA: Diagnosis not present

## 2021-07-26 DIAGNOSIS — T1490XA Injury, unspecified, initial encounter: Secondary | ICD-10-CM | POA: Diagnosis not present

## 2021-07-26 DIAGNOSIS — S2242XA Multiple fractures of ribs, left side, initial encounter for closed fracture: Secondary | ICD-10-CM | POA: Diagnosis not present

## 2021-07-26 DIAGNOSIS — I2510 Atherosclerotic heart disease of native coronary artery without angina pectoris: Secondary | ICD-10-CM | POA: Diagnosis not present

## 2021-07-26 DIAGNOSIS — S50311A Abrasion of right elbow, initial encounter: Secondary | ICD-10-CM | POA: Diagnosis not present

## 2021-07-26 DIAGNOSIS — I700 Atherosclerosis of aorta: Secondary | ICD-10-CM | POA: Insufficient documentation

## 2021-07-26 DIAGNOSIS — Z743 Need for continuous supervision: Secondary | ICD-10-CM | POA: Diagnosis not present

## 2021-07-26 DIAGNOSIS — S59901A Unspecified injury of right elbow, initial encounter: Secondary | ICD-10-CM | POA: Diagnosis present

## 2021-07-26 DIAGNOSIS — G8929 Other chronic pain: Secondary | ICD-10-CM | POA: Insufficient documentation

## 2021-07-26 DIAGNOSIS — S50312A Abrasion of left elbow, initial encounter: Secondary | ICD-10-CM | POA: Diagnosis not present

## 2021-07-26 DIAGNOSIS — S61411A Laceration without foreign body of right hand, initial encounter: Secondary | ICD-10-CM | POA: Diagnosis not present

## 2021-07-26 DIAGNOSIS — I10 Essential (primary) hypertension: Secondary | ICD-10-CM | POA: Diagnosis not present

## 2021-07-26 LAB — CBC WITH DIFFERENTIAL/PLATELET
Abs Immature Granulocytes: 0.07 10*3/uL (ref 0.00–0.07)
Basophils Absolute: 0 10*3/uL (ref 0.0–0.1)
Basophils Relative: 0 %
Eosinophils Absolute: 0 10*3/uL (ref 0.0–0.5)
Eosinophils Relative: 0 %
HCT: 40.2 % (ref 39.0–52.0)
Hemoglobin: 13.1 g/dL (ref 13.0–17.0)
Immature Granulocytes: 1 %
Lymphocytes Relative: 22 %
Lymphs Abs: 2.7 10*3/uL (ref 0.7–4.0)
MCH: 29 pg (ref 26.0–34.0)
MCHC: 32.6 g/dL (ref 30.0–36.0)
MCV: 88.9 fL (ref 80.0–100.0)
Monocytes Absolute: 0.8 10*3/uL (ref 0.1–1.0)
Monocytes Relative: 7 %
Neutro Abs: 8.6 10*3/uL — ABNORMAL HIGH (ref 1.7–7.7)
Neutrophils Relative %: 70 %
Platelets: 375 10*3/uL (ref 150–400)
RBC: 4.52 MIL/uL (ref 4.22–5.81)
RDW: 13.7 % (ref 11.5–15.5)
WBC: 12.2 10*3/uL — ABNORMAL HIGH (ref 4.0–10.5)
nRBC: 0 % (ref 0.0–0.2)

## 2021-07-26 LAB — BASIC METABOLIC PANEL
Anion gap: 9 (ref 5–15)
BUN: 16 mg/dL (ref 8–23)
CO2: 24 mmol/L (ref 22–32)
Calcium: 8.9 mg/dL (ref 8.9–10.3)
Chloride: 106 mmol/L (ref 98–111)
Creatinine, Ser: 1.36 mg/dL — ABNORMAL HIGH (ref 0.61–1.24)
GFR, Estimated: 58 mL/min — ABNORMAL LOW (ref >60)
Glucose, Bld: 105 mg/dL — ABNORMAL HIGH (ref 70–99)
Potassium: 3.6 mmol/L (ref 3.5–5.1)
Sodium: 139 mmol/L (ref 135–145)

## 2021-07-26 LAB — ETHANOL: Alcohol, Ethyl (B): <10 mg/dL (ref <10)

## 2021-07-26 MED ORDER — OXYCODONE-ACETAMINOPHEN 5-325 MG PO TABS: 1.0000 | ORAL_TABLET | Freq: Four times a day (QID) | ORAL | 0 refills | Status: DC | PRN

## 2021-07-26 MED ORDER — NAPROXEN 500 MG PO TABS: 500.0000 mg | ORAL_TABLET | Freq: Two times a day (BID) | ORAL | 0 refills | Status: DC

## 2021-07-26 MED ORDER — IOHEXOL 300 MG/ML  SOLN
100.0000 mL | Freq: Once | INTRAMUSCULAR | Status: AC | PRN
  Administered 2021-07-26: 100 mL via INTRAVENOUS

## 2021-07-26 MED ORDER — FENTANYL CITRATE PF 50 MCG/ML IJ SOSY
50.0000 ug | PREFILLED_SYRINGE | Freq: Once | INTRAMUSCULAR | Status: AC
  Administered 2021-07-26: 50 ug via INTRAVENOUS
  Filled 2021-07-26: qty 1

## 2021-07-26 MED ORDER — HYDROMORPHONE HCL 1 MG/ML IJ SOLN
1.0000 mg | Freq: Once | INTRAMUSCULAR | Status: AC
  Administered 2021-07-26: 1 mg via INTRAVENOUS
  Filled 2021-07-26: qty 1

## 2021-07-26 MED ORDER — BACITRACIN ZINC 500 UNIT/GM EX OINT
TOPICAL_OINTMENT | Freq: Once | CUTANEOUS | Status: AC
  Administered 2021-07-26: 5 via TOPICAL
  Filled 2021-07-26: qty 4.5
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
## 2021-07-28 ENCOUNTER — Emergency Department (HOSPITAL_COMMUNITY)
Admission: EM | Admit: 2021-07-28 | Discharge: 2021-07-29 | Disposition: A | Discharge status: Home / Self Care | Payer: Medicare HMO | Attending: Emergency Medicine | Admitting: Emergency Medicine

## 2021-07-28 ENCOUNTER — Encounter (HOSPITAL_COMMUNITY): Payer: Self-pay

## 2021-07-28 ENCOUNTER — Emergency Department (HOSPITAL_COMMUNITY): Payer: Medicare HMO

## 2021-07-28 ENCOUNTER — Other Ambulatory Visit: Payer: Self-pay

## 2021-07-28 DIAGNOSIS — M546 Pain in thoracic spine: Secondary | ICD-10-CM | POA: Diagnosis not present

## 2021-07-28 DIAGNOSIS — L03113 Cellulitis of right upper limb: Secondary | ICD-10-CM | POA: Diagnosis not present

## 2021-07-28 DIAGNOSIS — S51801A Unspecified open wound of right forearm, initial encounter: Secondary | ICD-10-CM | POA: Diagnosis not present

## 2021-07-28 DIAGNOSIS — L0390 Cellulitis, unspecified: Secondary | ICD-10-CM

## 2021-07-28 DIAGNOSIS — L089 Local infection of the skin and subcutaneous tissue, unspecified: Secondary | ICD-10-CM | POA: Diagnosis not present

## 2021-07-28 LAB — CBC
HCT: 40.2 % (ref 39.0–52.0)
Hemoglobin: 13.2 g/dL (ref 13.0–17.0)
MCH: 28.8 pg (ref 26.0–34.0)
MCHC: 32.8 g/dL (ref 30.0–36.0)
MCV: 87.8 fL (ref 80.0–100.0)
Platelets: 355 10*3/uL (ref 150–400)
RBC: 4.58 MIL/uL (ref 4.22–5.81)
RDW: 13.7 % (ref 11.5–15.5)
WBC: 12 10*3/uL — ABNORMAL HIGH (ref 4.0–10.5)
nRBC: 0 % (ref 0.0–0.2)

## 2021-07-28 LAB — BASIC METABOLIC PANEL
Anion gap: 9 (ref 5–15)
BUN: 12 mg/dL (ref 8–23)
CO2: 23 mmol/L (ref 22–32)
Calcium: 8.6 mg/dL — ABNORMAL LOW (ref 8.9–10.3)
Chloride: 102 mmol/L (ref 98–111)
Creatinine, Ser: 1.27 mg/dL — ABNORMAL HIGH (ref 0.61–1.24)
GFR, Estimated: >60 mL/min (ref >60)
Glucose, Bld: 125 mg/dL — ABNORMAL HIGH (ref 70–99)
Potassium: 3.5 mmol/L (ref 3.5–5.1)
Sodium: 134 mmol/L — ABNORMAL LOW (ref 135–145)

## 2021-07-28 MED ORDER — DEXTROSE 5 % IV SOLN
1500.0000 mg | Freq: Once | INTRAVENOUS | Status: AC
  Administered 2021-07-29: 1500 mg via INTRAVENOUS
  Filled 2021-07-28: qty 75

## 2021-07-28 MED ORDER — HYDROMORPHONE HCL 1 MG/ML IJ SOLN
2.0000 mg | Freq: Once | INTRAMUSCULAR | Status: AC
  Administered 2021-07-28: 2 mg via INTRAVENOUS
  Filled 2021-07-28: qty 2
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
## 2021-07-29 ENCOUNTER — Other Ambulatory Visit (HOSPITAL_COMMUNITY): Payer: Self-pay

## 2021-07-29 NOTE — Discharge Instructions (Signed)
You were evaluated in the Emergency Department and after careful evaluation, we did not find any emergent condition requiring admission or further testing in the hospital.  Your exam/testing today was overall reassuring.  Symptoms likely due to a skin infection.  We were able to wash/scrub your wounds here in the emergency department and gave you a strong single dose antibiotic.  Please return to the Emergency Department if you experience any worsening of your condition.  Thank you for allowing [place] to be a part of your care.

## 2021-08-05 ENCOUNTER — Other Ambulatory Visit: Payer: Self-pay

## 2021-08-05 ENCOUNTER — Emergency Department (HOSPITAL_COMMUNITY)
Admission: EM | Admit: 2021-08-05 | Discharge: 2021-08-05 | Disposition: A | Discharge status: Home / Self Care | Payer: Medicare HMO | Attending: Emergency Medicine | Admitting: Emergency Medicine

## 2021-08-05 ENCOUNTER — Encounter (HOSPITAL_COMMUNITY): Payer: Self-pay

## 2021-08-05 DIAGNOSIS — I10 Essential (primary) hypertension: Secondary | ICD-10-CM | POA: Insufficient documentation

## 2021-08-05 DIAGNOSIS — Z4800 Encounter for change or removal of nonsurgical wound dressing: Secondary | ICD-10-CM | POA: Diagnosis not present

## 2021-08-05 DIAGNOSIS — Z5189 Encounter for other specified aftercare: Secondary | ICD-10-CM

## 2021-08-05 DIAGNOSIS — Z4801 Encounter for change or removal of surgical wound dressing: Secondary | ICD-10-CM | POA: Diagnosis not present

## 2021-08-05 LAB — BASIC METABOLIC PANEL
Anion gap: 8 (ref 5–15)
BUN: 10 mg/dL (ref 8–23)
CO2: 25 mmol/L (ref 22–32)
Calcium: 8.7 mg/dL — ABNORMAL LOW (ref 8.9–10.3)
Chloride: 104 mmol/L (ref 98–111)
Creatinine, Ser: 1.14 mg/dL (ref 0.61–1.24)
GFR, Estimated: >60 mL/min (ref >60)
Glucose, Bld: 98 mg/dL (ref 70–99)
Potassium: 3.2 mmol/L — ABNORMAL LOW (ref 3.5–5.1)
Sodium: 137 mmol/L (ref 135–145)

## 2021-08-05 LAB — CBC
HCT: 37 % — ABNORMAL LOW (ref 39.0–52.0)
Hemoglobin: 12.3 g/dL — ABNORMAL LOW (ref 13.0–17.0)
MCH: 29.1 pg (ref 26.0–34.0)
MCHC: 33.2 g/dL (ref 30.0–36.0)
MCV: 87.7 fL (ref 80.0–100.0)
Platelets: 380 10*3/uL (ref 150–400)
RBC: 4.22 MIL/uL (ref 4.22–5.81)
RDW: 13.2 % (ref 11.5–15.5)
WBC: 10.1 10*3/uL (ref 4.0–10.5)
nRBC: 0 % (ref 0.0–0.2)

## 2021-08-05 MED ORDER — BACITRACIN ZINC 500 UNIT/GM EX OINT
TOPICAL_OINTMENT | Freq: Once | CUTANEOUS | Status: AC
  Filled 2021-08-05: qty 2.7

## 2021-08-05 MED ORDER — HYDROCODONE-ACETAMINOPHEN 5-325 MG PO TABS
1.0000 | ORAL_TABLET | Freq: Once | ORAL | Status: AC
  Administered 2021-08-05: 1 via ORAL
  Filled 2021-08-05: qty 1

## 2021-08-05 MED ORDER — MUPIROCIN 2 % EX OINT: 1.0000 | TOPICAL_OINTMENT | Freq: Three times a day (TID) | CUTANEOUS | 0 refills | Status: DC

## 2021-08-05 NOTE — Discharge Instructions (Signed)
Apply the antibiotic ointment to the wound.  Try to keep your wounds covered with a nonadhesive dressing.  Follow-up with a primary care doctor next week to be rechecked

## 2021-08-05 NOTE — ED Triage Notes (Signed)
Pov from home. Cc of needing his wounds rechecked from previous visits. States the ones on his left forearm are worse.
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
## 2021-08-12 ENCOUNTER — Ambulatory Visit: Payer: Medicare HMO | Admitting: Internal Medicine

## 2021-08-26 ENCOUNTER — Ambulatory Visit: Payer: Medicare HMO | Admitting: Family Medicine

## 2021-09-08 ENCOUNTER — Encounter: Payer: Self-pay | Admitting: *Deleted

## 2021-09-08 ENCOUNTER — Ambulatory Visit (INDEPENDENT_AMBULATORY_CARE_PROVIDER_SITE_OTHER): Payer: Medicare HMO | Admitting: Nurse Practitioner

## 2021-09-08 ENCOUNTER — Encounter: Payer: Self-pay | Admitting: Nurse Practitioner

## 2021-09-08 VITALS — BP 170/92 | HR 103 | Ht 67.0 in | Wt 224.0 lb

## 2021-09-08 DIAGNOSIS — F339 Major depressive disorder, recurrent, unspecified: Secondary | ICD-10-CM | POA: Diagnosis not present

## 2021-09-08 DIAGNOSIS — I10 Essential (primary) hypertension: Secondary | ICD-10-CM

## 2021-09-08 DIAGNOSIS — Z139 Encounter for screening, unspecified: Secondary | ICD-10-CM

## 2021-09-08 DIAGNOSIS — Z1211 Encounter for screening for malignant neoplasm of colon: Secondary | ICD-10-CM | POA: Diagnosis not present

## 2021-09-08 NOTE — Assessment & Plan Note (Signed)
Chronic uncontrolled condition Follwed by psychiatrist takes paxil '30mg'$  daily ,clonazepam '1mg'$  TID  has upcoming appointment with psch, he has been seeing the same psychiatrist for 20 years but he does not like the doctor. He denies SI, HI, on Adderall '30mg'$  TID.  pstient was encouraged to maintain close follow up with psych.
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
## 2021-09-08 NOTE — Assessment & Plan Note (Signed)
Healing skin tears and abrasion noted on bilateral upper extremities no erythema, drainage noted , ambulating without assistance today.

## 2021-09-08 NOTE — Patient Instructions (Addendum)
Please get your shingles vaccine at the pharmacy.   Monitor your blood pressure at home, keep a log and bring to next visit . Blood pressure goal is less than 140/90  It is important that you exercise regularly at least 30 minutes 5 times a week.  Think about what you will eat, plan ahead. Choose " clean, green, fresh or frozen" over canned, processed or packaged foods which are more sugary, salty and fatty. 70 to 75% of food eaten should be vegetables and fruit. Three meals at set times with snacks allowed between meals, but they must be fruit or vegetables. Aim to eat over a 12 hour period , example 7 am to 7 pm, and STOP after  your last meal of the day. Drink water,generally about 64 ounces per day, no other drink is as healthy. Fruit juice is best enjoyed in a healthy way, by EATING the fruit.  Thanks for choosing [facility], we consider it a privelige to serve you.

## 2021-09-08 NOTE — Assessment & Plan Note (Signed)
BP Readings from Last 3 Encounters:  09/08/21 (!) 170/92  08/05/21 (!) 163/101  07/29/21 (!) 168/88  Chronic condition uncontrolled Currently not on BP meds Refused BP recheck today , refused medication for his BP Patient encouraged to monitor BP daily home keep a log anHealing skin tears and abrasion noted , no erythema, drainage noted d bring to next visit in 4 weeks , DASH diet advised.

## 2021-09-13 ENCOUNTER — Emergency Department (HOSPITAL_COMMUNITY): Payer: Medicare HMO

## 2021-09-13 ENCOUNTER — Emergency Department (HOSPITAL_COMMUNITY)
Admission: EM | Admit: 2021-09-13 | Discharge: 2021-09-13 | Disposition: A | Discharge status: Home / Self Care | Payer: Medicare HMO | Attending: Emergency Medicine | Admitting: Emergency Medicine

## 2021-09-13 ENCOUNTER — Other Ambulatory Visit: Payer: Self-pay

## 2021-09-13 ENCOUNTER — Encounter (HOSPITAL_COMMUNITY): Payer: Self-pay

## 2021-09-13 DIAGNOSIS — S0083XA Contusion of other part of head, initial encounter: Secondary | ICD-10-CM | POA: Insufficient documentation

## 2021-09-13 DIAGNOSIS — R519 Headache, unspecified: Secondary | ICD-10-CM | POA: Diagnosis not present

## 2021-09-13 DIAGNOSIS — T1490XA Injury, unspecified, initial encounter: Secondary | ICD-10-CM | POA: Diagnosis not present

## 2021-09-13 DIAGNOSIS — H9203 Otalgia, bilateral: Secondary | ICD-10-CM | POA: Diagnosis not present

## 2021-09-13 DIAGNOSIS — S0033XA Contusion of nose, initial encounter: Secondary | ICD-10-CM | POA: Diagnosis not present

## 2021-09-13 DIAGNOSIS — H9201 Otalgia, right ear: Secondary | ICD-10-CM | POA: Diagnosis not present

## 2021-09-13 DIAGNOSIS — S0990XA Unspecified injury of head, initial encounter: Secondary | ICD-10-CM | POA: Diagnosis not present

## 2021-09-13 DIAGNOSIS — H9202 Otalgia, left ear: Secondary | ICD-10-CM | POA: Diagnosis not present

## 2021-09-13 MED ORDER — LORAZEPAM 2 MG/ML IJ SOLN
1.0000 mg | Freq: Once | INTRAMUSCULAR | Status: AC
  Administered 2021-09-13: 1 mg via INTRAMUSCULAR
  Filled 2021-09-13: qty 1

## 2021-09-13 MED ORDER — TETRACAINE HCL 0.5 % OP SOLN
1.0000 [drp] | Freq: Once | OPHTHALMIC | Status: AC
  Administered 2021-09-13: 1 [drp] via OPHTHALMIC
  Filled 2021-09-13: qty 4
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
## 2021-09-13 NOTE — ED Notes (Signed)
Patient transported to CT 

## 2021-09-14 ENCOUNTER — Telehealth: Payer: Self-pay | Admitting: *Deleted
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
## 2021-09-24 ENCOUNTER — Ambulatory Visit (HOSPITAL_COMMUNITY)
Admission: EM | Admit: 2021-09-24 | Discharge: 2021-09-25 | Discharge status: Against Medical Advice | Payer: Medicare HMO | Attending: Nurse Practitioner | Admitting: Nurse Practitioner

## 2021-09-24 DIAGNOSIS — R45851 Suicidal ideations: Secondary | ICD-10-CM | POA: Insufficient documentation

## 2021-09-24 DIAGNOSIS — Z20822 Contact with and (suspected) exposure to covid-19: Secondary | ICD-10-CM | POA: Insufficient documentation

## 2021-09-24 DIAGNOSIS — Z7689 Persons encountering health services in other specified circumstances: Secondary | ICD-10-CM | POA: Insufficient documentation

## 2021-09-24 DIAGNOSIS — F4310 Post-traumatic stress disorder, unspecified: Secondary | ICD-10-CM | POA: Insufficient documentation

## 2021-09-24 DIAGNOSIS — F332 Major depressive disorder, recurrent severe without psychotic features: Secondary | ICD-10-CM | POA: Insufficient documentation

## 2021-09-25 ENCOUNTER — Inpatient Hospital Stay (HOSPITAL_COMMUNITY): Admission: AD | Admit: 2021-09-25 | Payer: Medicare HMO | Source: Intra-hospital | Admitting: Psychiatry

## 2021-09-25 DIAGNOSIS — R45851 Suicidal ideations: Secondary | ICD-10-CM | POA: Diagnosis not present

## 2021-09-25 DIAGNOSIS — Z7689 Persons encountering health services in other specified circumstances: Secondary | ICD-10-CM | POA: Diagnosis not present

## 2021-09-25 DIAGNOSIS — F332 Major depressive disorder, recurrent severe without psychotic features: Secondary | ICD-10-CM

## 2021-09-25 DIAGNOSIS — F4310 Post-traumatic stress disorder, unspecified: Secondary | ICD-10-CM | POA: Diagnosis not present

## 2021-09-25 DIAGNOSIS — Z20822 Contact with and (suspected) exposure to covid-19: Secondary | ICD-10-CM | POA: Diagnosis not present

## 2021-09-25 LAB — POCT URINE DRUG SCREEN - MANUAL ENTRY (I-SCREEN)
POC Amphetamine UR: POSITIVE — AB
POC Buprenorphine (BUP): NOT DETECTED
POC Cocaine UR: POSITIVE — AB
POC Marijuana UR: POSITIVE — AB
POC Methadone UR: NOT DETECTED
POC Methamphetamine UR: NOT DETECTED
POC Morphine: NOT DETECTED
POC Oxazepam (BZO): POSITIVE — AB
POC Oxycodone UR: NOT DETECTED
POC Secobarbital (BAR): NOT DETECTED

## 2021-09-25 LAB — RESP PANEL BY RT-PCR (FLU A&B, COVID) ARPGX2
Influenza A by PCR: NEGATIVE
Influenza B by PCR: NEGATIVE
SARS Coronavirus 2 by RT PCR: NEGATIVE

## 2021-09-25 LAB — CBC WITH DIFFERENTIAL/PLATELET
Abs Immature Granulocytes: 0.06 10*3/uL (ref 0.00–0.07)
Basophils Absolute: 0.1 10*3/uL (ref 0.0–0.1)
Basophils Relative: 1 %
Eosinophils Absolute: 0.1 10*3/uL (ref 0.0–0.5)
Eosinophils Relative: 1 %
HCT: 36.6 % — ABNORMAL LOW (ref 39.0–52.0)
Hemoglobin: 12.1 g/dL — ABNORMAL LOW (ref 13.0–17.0)
Immature Granulocytes: 1 %
Lymphocytes Relative: 43 %
Lymphs Abs: 4.7 10*3/uL — ABNORMAL HIGH (ref 0.7–4.0)
MCH: 29 pg (ref 26.0–34.0)
MCHC: 33.1 g/dL (ref 30.0–36.0)
MCV: 87.8 fL (ref 80.0–100.0)
Monocytes Absolute: 0.7 10*3/uL (ref 0.1–1.0)
Monocytes Relative: 7 %
Neutro Abs: 5.4 10*3/uL (ref 1.7–7.7)
Neutrophils Relative %: 47 %
Platelets: 364 10*3/uL (ref 150–400)
RBC: 4.17 MIL/uL — ABNORMAL LOW (ref 4.22–5.81)
RDW: 12.9 % (ref 11.5–15.5)
WBC: 11.1 10*3/uL — ABNORMAL HIGH (ref 4.0–10.5)
nRBC: 0 % (ref 0.0–0.2)

## 2021-09-25 LAB — COMPREHENSIVE METABOLIC PANEL
ALT: 14 U/L (ref 0–44)
AST: 26 U/L (ref 15–41)
Albumin: 3.3 g/dL — ABNORMAL LOW (ref 3.5–5.0)
Alkaline Phosphatase: 89 U/L (ref 38–126)
Anion gap: 6 (ref 5–15)
BUN: 15 mg/dL (ref 8–23)
CO2: 25 mmol/L (ref 22–32)
Calcium: 8.7 mg/dL — ABNORMAL LOW (ref 8.9–10.3)
Chloride: 105 mmol/L (ref 98–111)
Creatinine, Ser: 1.36 mg/dL — ABNORMAL HIGH (ref 0.61–1.24)
GFR, Estimated: 58 mL/min — ABNORMAL LOW (ref >60)
Glucose, Bld: 110 mg/dL — ABNORMAL HIGH (ref 70–99)
Potassium: 3.9 mmol/L (ref 3.5–5.1)
Sodium: 136 mmol/L (ref 135–145)
Total Bilirubin: 0.3 mg/dL (ref 0.3–1.2)
Total Protein: 6.7 g/dL (ref 6.5–8.1)

## 2021-09-25 LAB — TSH: TSH: 4.099 u[IU]/mL (ref 0.350–4.500)

## 2021-09-25 LAB — LIPID PANEL
Cholesterol: 180 mg/dL (ref 0–200)
HDL: 54 mg/dL (ref >40)
LDL Cholesterol: 103 mg/dL — ABNORMAL HIGH (ref 0–99)
Total CHOL/HDL Ratio: 3.3 RATIO
Triglycerides: 114 mg/dL (ref <150)
VLDL: 23 mg/dL (ref 0–40)

## 2021-09-25 LAB — HEMOGLOBIN A1C
Hgb A1c MFr Bld: 5.3 % (ref 4.8–5.6)
Mean Plasma Glucose: 105.41 mg/dL

## 2021-09-25 MED ORDER — ACETAMINOPHEN 325 MG PO TABS: 650.0000 mg | ORAL_TABLET | Freq: Four times a day (QID) | ORAL | Status: DC | PRN

## 2021-09-25 MED ORDER — MAGNESIUM HYDROXIDE 400 MG/5ML PO SUSP: 30.0000 mL | Freq: Every day | ORAL | Status: DC | PRN

## 2021-09-25 MED ORDER — CLONAZEPAM 1 MG PO TABS
1.0000 mg | ORAL_TABLET | Freq: Three times a day (TID) | ORAL | Status: DC
  Administered 2021-09-25: 1 mg via ORAL
  Filled 2021-09-25 (×2): qty 1

## 2021-09-25 MED ORDER — CLONIDINE HCL 0.1 MG PO TABS: 0.1000 mg | ORAL_TABLET | Freq: Every day | ORAL | Status: DC

## 2021-09-25 MED ORDER — ALUM & MAG HYDROXIDE-SIMETH 200-200-20 MG/5ML PO SUSP: 30.0000 mL | ORAL | Status: DC | PRN

## 2021-09-25 MED ORDER — SERTRALINE HCL 25 MG PO TABS
25.0000 mg | ORAL_TABLET | Freq: Every day | ORAL | Status: DC
  Administered 2021-09-25: 25 mg via ORAL
  Filled 2021-09-25: qty 1

## 2021-09-25 MED ORDER — CLONIDINE HCL 0.1 MG PO TABS
0.1000 mg | ORAL_TABLET | Freq: Every day | ORAL | Status: DC
  Administered 2021-09-25: 0.1 mg via ORAL
  Filled 2021-09-25: qty 1

## 2021-09-25 MED ORDER — CLONAZEPAM 1 MG PO TABS: 1.0000 mg | ORAL_TABLET | Freq: Three times a day (TID) | ORAL | Status: DC

## 2021-09-25 MED ORDER — DIPHENHYDRAMINE HCL 25 MG PO CAPS
25.0000 mg | ORAL_CAPSULE | Freq: Every evening | ORAL | Status: DC | PRN
  Administered 2021-09-25: 25 mg via ORAL
  Filled 2021-09-25: qty 1

## 2021-09-25 MED ORDER — HYDROXYZINE HCL 25 MG PO TABS
25.0000 mg | ORAL_TABLET | Freq: Three times a day (TID) | ORAL | Status: DC | PRN
  Administered 2021-09-25: 25 mg via ORAL
  Filled 2021-09-25 (×2): qty 1
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
## 2021-09-25 NOTE — ED Notes (Signed)
Pt complaining of anxiety  He was offered hydroxyzine.  When he was told that it was for anxiety and was not a Benzo  pt spit it into the trash and started crying.   Marland Kitchen

## 2021-09-25 NOTE — ED Notes (Signed)
Elsie Amis NP currently talking with pt.
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
## 2021-09-25 NOTE — Progress Notes (Signed)
Pt was accepted to [facility] Today 09/25/21; Bed Assignment 302-1  Pt meets inpatient criteria per Erasmo Score, NP  Attending Physician will be Dr. Caswell Corwin   Report can be called to: Adult unit: [phone]  Pt can arrive after: 2:00pm  Care Team Notified: Tharon Aquas, NP, Drema Halon, RN, and [place], RN  [place], [place] 09/25/2021 @ 1:13 PM

## 2021-09-25 NOTE — ED Notes (Signed)
Pt sleeping with no signs of distress or disturbance, respirations are easy and skin color is WNL.
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
## 2021-09-25 NOTE — Progress Notes (Signed)
Pt is accepted to [facility] 09/25/21. [place], RN will assist and coordinate.   Benjaman Kindler, MSW, LCSWA 09/25/2021 1:10 PM
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
## 2021-09-25 NOTE — ED Notes (Signed)
Report given to [facility]

## 2021-09-25 NOTE — ED Notes (Addendum)
Pt at the nurses desk window requesting something for anxiety. Requested to go to his car and get his meds. Informed Pt that this nurse could not allow him to get meds from his car or a locker, that the prescribed meds from the provider could be admin and that he had Atarax ordered for anxiety. Pulled the Atarax from the pyxis, scanned Pt bracelet and gave pill to the pt. Pt then spit the pill in the trash can. Other nurse at the side of the pt. Pt began to cry prior to ambulating to the nurses desk window to be scanned. Pt assisted back to the pull out chair/bed by other nurse. Pt continues to sit on the pull out bed/chair and cry. Condolence offered. Pt was under the impression that the Atarax was a Benzo. Explained to Pt that the pill was Atarax, in the same class as Benadryl but not the same pill. Safety maintained and will continue to monitor.

## 2021-09-25 NOTE — ED Notes (Signed)
Pt is refusing to sign the vol consent for [facility]. he stated " I tried to go there three times and they would not help me".

## 2021-09-27 ENCOUNTER — Emergency Department (HOSPITAL_COMMUNITY)
Admission: EM | Admit: 2021-09-27 | Discharge: 2021-09-27 | Disposition: A | Discharge status: Home / Self Care | Payer: Medicare HMO | Source: Home / Self Care

## 2021-09-27 ENCOUNTER — Other Ambulatory Visit: Payer: Self-pay

## 2021-09-27 ENCOUNTER — Emergency Department (HOSPITAL_COMMUNITY)
Admission: EM | Admit: 2021-09-27 | Discharge: 2021-09-29 | Disposition: A | Discharge status: Other Acute Inpatient Hospital | Payer: Medicare HMO | Attending: Emergency Medicine | Admitting: Emergency Medicine

## 2021-09-27 ENCOUNTER — Encounter (HOSPITAL_COMMUNITY): Payer: Self-pay | Admitting: Emergency Medicine

## 2021-09-27 DIAGNOSIS — F329 Major depressive disorder, single episode, unspecified: Secondary | ICD-10-CM | POA: Insufficient documentation

## 2021-09-27 DIAGNOSIS — Z046 Encounter for general psychiatric examination, requested by authority: Secondary | ICD-10-CM | POA: Diagnosis present

## 2021-09-27 DIAGNOSIS — D649 Anemia, unspecified: Secondary | ICD-10-CM | POA: Insufficient documentation

## 2021-09-27 DIAGNOSIS — Y900 Blood alcohol level of less than 20 mg/100 ml: Secondary | ICD-10-CM | POA: Insufficient documentation

## 2021-09-27 DIAGNOSIS — D72829 Elevated white blood cell count, unspecified: Secondary | ICD-10-CM | POA: Diagnosis not present

## 2021-09-27 DIAGNOSIS — Z20822 Contact with and (suspected) exposure to covid-19: Secondary | ICD-10-CM | POA: Insufficient documentation

## 2021-09-27 DIAGNOSIS — R45851 Suicidal ideations: Secondary | ICD-10-CM | POA: Diagnosis not present

## 2021-09-27 DIAGNOSIS — R9431 Abnormal electrocardiogram [ECG] [EKG]: Secondary | ICD-10-CM | POA: Diagnosis not present

## 2021-09-27 LAB — COMPREHENSIVE METABOLIC PANEL
ALT: 14 U/L (ref 0–44)
AST: 18 U/L (ref 15–41)
Albumin: 3.6 g/dL (ref 3.5–5.0)
Alkaline Phosphatase: 103 U/L (ref 38–126)
Anion gap: 9 (ref 5–15)
BUN: 18 mg/dL (ref 8–23)
CO2: 27 mmol/L (ref 22–32)
Calcium: 8.9 mg/dL (ref 8.9–10.3)
Chloride: 106 mmol/L (ref 98–111)
Creatinine, Ser: 1.21 mg/dL (ref 0.61–1.24)
GFR, Estimated: >60 mL/min (ref >60)
Glucose, Bld: 103 mg/dL — ABNORMAL HIGH (ref 70–99)
Potassium: 4.7 mmol/L (ref 3.5–5.1)
Sodium: 142 mmol/L (ref 135–145)
Total Bilirubin: 0.3 mg/dL (ref 0.3–1.2)
Total Protein: 7.2 g/dL (ref 6.5–8.1)

## 2021-09-27 LAB — CBC WITH DIFFERENTIAL/PLATELET
Abs Immature Granulocytes: 0.1 10*3/uL — ABNORMAL HIGH (ref 0.00–0.07)
Basophils Absolute: 0.1 10*3/uL (ref 0.0–0.1)
Basophils Relative: 1 %
Eosinophils Absolute: 0.1 10*3/uL (ref 0.0–0.5)
Eosinophils Relative: 1 %
HCT: 37.4 % — ABNORMAL LOW (ref 39.0–52.0)
Hemoglobin: 12 g/dL — ABNORMAL LOW (ref 13.0–17.0)
Immature Granulocytes: 1 %
Lymphocytes Relative: 26 %
Lymphs Abs: 3 10*3/uL (ref 0.7–4.0)
MCH: 29.2 pg (ref 26.0–34.0)
MCHC: 32.1 g/dL (ref 30.0–36.0)
MCV: 91 fL (ref 80.0–100.0)
Monocytes Absolute: 0.9 10*3/uL (ref 0.1–1.0)
Monocytes Relative: 8 %
Neutro Abs: 7.5 10*3/uL (ref 1.7–7.7)
Neutrophils Relative %: 63 %
Platelets: 338 10*3/uL (ref 150–400)
RBC: 4.11 MIL/uL — ABNORMAL LOW (ref 4.22–5.81)
RDW: 13 % (ref 11.5–15.5)
WBC: 11.7 10*3/uL — ABNORMAL HIGH (ref 4.0–10.5)
nRBC: 0 % (ref 0.0–0.2)

## 2021-09-27 LAB — RESP PANEL BY RT-PCR (FLU A&B, COVID) ARPGX2
Influenza A by PCR: NEGATIVE
Influenza B by PCR: NEGATIVE
SARS Coronavirus 2 by RT PCR: NEGATIVE

## 2021-09-27 LAB — ETHANOL: Alcohol, Ethyl (B): <10 mg/dL (ref <10)

## 2021-09-27 MED ORDER — LORAZEPAM 2 MG/ML IJ SOLN
1.0000 mg | Freq: Once | INTRAMUSCULAR | Status: AC
  Administered 2021-09-27: 1 mg via INTRAMUSCULAR
  Filled 2021-09-27: qty 1
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
## 2021-09-27 NOTE — ED Notes (Signed)
TTS at bedside for evaluation
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
## 2021-09-28 ENCOUNTER — Encounter: Payer: Medicare HMO | Admitting: Internal Medicine

## 2021-09-28 ENCOUNTER — Encounter: Payer: Self-pay | Admitting: Internal Medicine

## 2021-09-28 DIAGNOSIS — R45851 Suicidal ideations: Secondary | ICD-10-CM | POA: Diagnosis not present

## 2021-09-28 LAB — RAPID URINE DRUG SCREEN, HOSP PERFORMED
Amphetamines: POSITIVE — AB
Barbiturates: NOT DETECTED
Benzodiazepines: POSITIVE — AB
Cocaine: POSITIVE — AB
Opiates: NOT DETECTED
Tetrahydrocannabinol: NOT DETECTED

## 2021-09-28 MED ORDER — AMPHETAMINE-DEXTROAMPHET ER 30 MG PO CP24
30.0000 mg | ORAL_CAPSULE | Freq: Three times a day (TID) | ORAL | Status: DC
  Administered 2021-09-28 (×3): 30 mg via ORAL
  Filled 2021-09-28 (×3): qty 1

## 2021-09-28 MED ORDER — CLORAZEPATE DIPOTASSIUM 7.5 MG PO TABS
3.7500 mg | ORAL_TABLET | Freq: Two times a day (BID) | ORAL | Status: DC
  Administered 2021-09-28 (×2): 3.75 mg via ORAL
  Filled 2021-09-28 (×2): qty 1

## 2021-09-28 MED ORDER — LORAZEPAM 1 MG PO TABS
1.0000 mg | ORAL_TABLET | Freq: Four times a day (QID) | ORAL | Status: DC | PRN
  Administered 2021-09-28 (×3): 1 mg via ORAL
  Filled 2021-09-28 (×3): qty 1

## 2021-09-28 MED ORDER — CLONAZEPAM 0.5 MG PO TABS
1.0000 mg | ORAL_TABLET | Freq: Every day | ORAL | Status: DC
  Administered 2021-09-28: 1 mg via ORAL
  Filled 2021-09-28: qty 2

## 2021-09-28 NOTE — ED Notes (Addendum)
Pt was given breakfast tray. Pt continues to cry stating that he wants go home.

## 2021-09-28 NOTE — ED Notes (Signed)
Pt crying uncontrollably. States he is having a panic attack and wants his anxiety meds. Dr Roxanne Mins notified.

## 2021-09-28 NOTE — ED Notes (Signed)
Wife called to speak with pt.

## 2021-09-28 NOTE — ED Notes (Signed)
Pt moaning in hallway Asking for home meds- specifically anxiety med. RN notified

## 2021-09-28 NOTE — ED Notes (Signed)
TTS in progress 

## 2021-09-28 NOTE — Progress Notes (Signed)
Pt was accepted to [facility] 09/29/21; Main Campus  Pt meets inpatient criteria per Garrison Columbus, FNP  Attending Physician will be Dr. Jonelle Sports  Report can be called to: [phone] or [phone]   Pt can arrive after 9:00am  Care Team notified:Brandy [place], [address], [place] 09/28/2021 @ 10:31 PM

## 2021-09-28 NOTE — Progress Notes (Signed)
Inpatient Behavioral Health Placement  Pt meets inpatient criteria per Garrison Columbus, FNP.  There are no available beds at [facility]. Referral was sent to the following facilities;   Destination Service Provider Address Phone [facility]  [address], [place] [address] [place]  [facility]  [address] [place] [address] [phone] [facility]  [place], [place] [address] [place]  [facility]  [address] [place] [address] [phone] [facility]  [place], [name] [address] [phone] [facility]  [place] [address] [phone] [facility]  [address] [place] [address] [phone]  [facility]  [address] [place] [address] [phone] [facility]  [address] [place] [address] [phone]  [facility]  [address] [place] [address] [phone] [facility]  [address], [place] [address] [phone] [facility]  [address] [place] [address] [phone] [facility]  [place], [place] [address] [phone]  [facility]  [place] [address], [place] [address] [phone]  [facility]  [address], [place] [address] [phone] [facility]  [address] [place] [address] [phone]    Situation ongoing,  CSW will follow up.   Benjaman Kindler, MSW, LCSWA 09/28/2021  @ 10:20 PM
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
## 2021-09-28 NOTE — ED Notes (Signed)
Patient speaking with TTS at this time.

## 2021-09-28 NOTE — ED Notes (Signed)
Pt with c/o anxiety, ativan '1mg'$  PO given.

## 2021-09-28 NOTE — ED Notes (Signed)
Pt has now fell asleep

## 2021-09-29 DIAGNOSIS — G4700 Insomnia, unspecified: Secondary | ICD-10-CM | POA: Diagnosis not present

## 2021-09-29 DIAGNOSIS — M549 Dorsalgia, unspecified: Secondary | ICD-10-CM | POA: Diagnosis not present

## 2021-09-29 DIAGNOSIS — F310 Bipolar disorder, current episode hypomanic: Secondary | ICD-10-CM | POA: Diagnosis not present

## 2021-09-29 DIAGNOSIS — F909 Attention-deficit hyperactivity disorder, unspecified type: Secondary | ICD-10-CM | POA: Diagnosis not present

## 2021-09-29 DIAGNOSIS — F332 Major depressive disorder, recurrent severe without psychotic features: Secondary | ICD-10-CM | POA: Diagnosis not present

## 2021-09-29 DIAGNOSIS — S51011D Laceration without foreign body of right elbow, subsequent encounter: Secondary | ICD-10-CM | POA: Diagnosis not present

## 2021-09-29 DIAGNOSIS — F419 Anxiety disorder, unspecified: Secondary | ICD-10-CM | POA: Diagnosis not present

## 2021-09-29 DIAGNOSIS — M1990 Unspecified osteoarthritis, unspecified site: Secondary | ICD-10-CM | POA: Diagnosis not present

## 2021-09-29 DIAGNOSIS — X58XXXD Exposure to other specified factors, subsequent encounter: Secondary | ICD-10-CM | POA: Diagnosis not present

## 2021-09-29 DIAGNOSIS — G8929 Other chronic pain: Secondary | ICD-10-CM | POA: Diagnosis not present

## 2021-09-29 DIAGNOSIS — R45851 Suicidal ideations: Secondary | ICD-10-CM | POA: Diagnosis not present

## 2021-09-29 NOTE — ED Notes (Signed)
Pt. Has been having a conversation with "someone" for 30 plus minutes. About suicide. No one is in the room

## 2021-09-29 NOTE — ED Notes (Signed)
Pt crying in room . Ativan given

## 2021-09-29 NOTE — ED Provider Notes (Signed)
Emergency Medicine Observation Re-evaluation Note  Patrick Cruz is a 61 y.o. male, seen on rounds today.  Pt initially presented to the ED for complaints of Suicidal Currently, the patient is resting. He is "scared".  Physical Exam  BP (!) 186/67   Pulse 95   Temp 98 F (36.7 C)   Resp 18   Ht '5\' 7"'$  (1.702 m)   Wt 101.6 kg   SpO2 100%   BMI 35.08 kg/m  Physical Exam General: no acute distrss Lungs: normal effort Psych: no agitation  ED Course / MDM  EKG:EKG Interpretation  Date/Time:  Sunday September 27 2021 18:27:59 EDT Ventricular Rate:  91 PR Interval:  154 QRS Duration: 100 QT Interval:  358 QTC Calculation: 440 R Axis:   39 Text Interpretation: Normal sinus rhythm Incomplete right bundle branch block Borderline ECG When compared with ECG of 05-Aug-2021 19:33, PREVIOUS ECG IS PRESENT Confirmed by Wandra Arthurs [phone]) on 09/28/2021 3:27:33 PM  I have reviewed the labs performed to date as well as medications administered while in observation.  No recent changes in the last 24 hours  Plan  Current plan is for admission to [facility]. Accepting is Dr. Selinda Flavin. Patient appears stable for transfer.    Sherwood Gambler, MD 09/29/21 [phone]

## 2021-09-29 NOTE — ED Notes (Signed)
Pt. Upset pulling on rails of the bed and punching the bed

## 2021-09-29 NOTE — ED Notes (Signed)
Pt. Is very upset and cursing because he cant sleep. Pt also is demanding that the other patients Turn off their tv. Trying to redirect pt to calm down so other patients will not get upset or wake up.

## 2021-10-06 ENCOUNTER — Telehealth: Payer: Self-pay

## 2021-10-06 NOTE — Telephone Encounter (Signed)
        Patient  visited [place] ed on 8/29  for [facility]   Telephone encounter attempt :  1ST  A HIPAA compliant voice message was left requesting a return call.  Instructed patient to call back at earliest convenience.    [place], Care Management  [phone] [address], [place], [place] [address] Phone: [phone] Email: [email or web address]

## 2021-10-14 ENCOUNTER — Telehealth: Payer: Self-pay | Admitting: *Deleted

## 2021-10-14 ENCOUNTER — Encounter: Payer: Self-pay | Admitting: *Deleted

## 2021-10-14 NOTE — Patient Outreach (Signed)
  Care Coordination   Initial Visit Note   10/14/2021 Name: Patrick Cruz MRN: [id number] DOB: 09-16-1960  Patrick Cruz is a 61 y.o. year old male who sees Pcp, No for primary care. I spoke with  Patrick Cruz by phone today.  What matters to the patients health and wellness today?  "Want to get my own place, I live in a hotel"    Goals Addressed               This Visit's Progress     COMPLETED: "want to get my own place, I live in a hotel" (pt-stated)        Care Coordination Interventions: Patient interviewed about adult health maintenance status including  Depression screening completed, PHQ9=16 , RN care manager completed referral for LCSW Provided education about Importance of taking medications as prescribed, reviewed medications with patient, offered physical referral line number so patient can obtain primary care provider, pt declined Patient reports his main concern is mental health issues, depression, marital problems, issues with his adult child, does not have a mental health counselor citing " they didn't really do a good job"  pt states he was recently in a behavioral health facility "because my wife said I wanted to kill myself"  Pt states he has no plan to harm himself "and wouldn't do anything like that" Patient reports he is temporarily living in a hotel away from his wife and would like to find some type of housing, 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 Patient denies he has HTN and is upset that "they think I have this when I don't" [facility] care coordination program explained to patient who is agreeable to work with Education officer, museum on mental health issues, resources.         SDOH assessments and interventions completed:  Yes  SDOH Interventions Today    Flowsheet Row Most Recent Value  SDOH Interventions   Food Insecurity Interventions Intervention Not Indicated  Transportation Interventions Intervention Not Indicated  [pt is renting a truck at present and  is able to drive]  Depression Interventions/Treatment  Medication  [Referral completed today for LCSW]        Care Coordination Interventions Activated:  Yes  Care Coordination Interventions:  Yes, provided   Follow up plan: Referral made to social worker    Encounter Outcome:  Pt. Visit Completed   Jacqlyn Larsen [facility], BSN [facility] RN Care Coordinator [phone]

## 2021-10-14 NOTE — Chronic Care Management (AMB) (Signed)
  Care Coordination  Outreach Note  10/14/2021 Name: Patrick Cruz MRN: [id number] DOB: [birth year]   Care Coordination Outreach Attempts: An unsuccessful telephone outreach was attempted today to schedule referral for SW from [place] for coordination services as a benefit of their health plan.   Follow Up Plan:  Additional outreach attempts will be made to offer the patient care coordination information and services.   Encounter Outcome:  No Answer   [place]  Direct Dial: [phone]

## 2021-10-15 ENCOUNTER — Encounter: Payer: Self-pay | Admitting: *Deleted

## 2021-10-15 ENCOUNTER — Telehealth: Payer: Self-pay | Admitting: *Deleted

## 2021-10-15 NOTE — Chronic Care Management (AMB) (Signed)
  Care Coordination   Note   10/15/2021 Name: Patrick Cruz MRN: [id number] DOB: [birth year]  Patrick Cruz is a 61 y.o. year old male who sees Pcp, No for primary care. I reached out to [name] by phone today to offer care coordination services.   Follow up plan:  Telephone appointment with care coordination team member scheduled for:  10/15/21  Encounter Outcome:  Pt. Scheduled  [place]  Direct Dial: [phone]

## 2021-10-16 ENCOUNTER — Encounter: Payer: Medicare HMO | Admitting: *Deleted

## 2021-10-16 ENCOUNTER — Telehealth: Payer: Self-pay | Admitting: *Deleted

## 2021-10-16 ENCOUNTER — Encounter: Payer: Self-pay | Admitting: *Deleted

## 2021-10-16 NOTE — Patient Outreach (Signed)
  Care Coordination   Initial Visit Note   10/16/2021 Name: Patrick Cruz MRN: [id number] DOB: 16-Jun-1960  KO BARDON is a 61 y.o. year old male who sees Pcp, No for primary care. I spoke with  Patrick Cruz by phone today.  What matters to the patients health and wellness today?  "Everything has gone bad since I retired"   CSW was able to speak with pt today however was unable to complete assessment/screening due to pt needing to go to another appointment.  Pt expressed a lot of sadness, challenges- relationship, financial, etc.  Pt is staying at a motel but plans to seek rental options this week/weekend- has several good options in [place] he reports. Pt denies SI/HI and indicates he does have depression and sees Psychiatrist every 39month (Patrick Cruz.  "I have been in survival mode" He is hopeful with housing and is encouraged to consider counseling (at [facility]?)     Goals Addressed   None     SDOH assessments and interventions completed:  Yes  SDOH Interventions Today    Flowsheet Row Most Recent Value  SDOH Interventions   Housing Interventions Other (Comment)  [staying in motel and qualifies for rental([place]) and going to look at them tomorrow (10/16/21)]  Utilities Interventions Intervention Not Indicated  Alcohol Usage Interventions Intervention Not Indicated (Score <7)  Financial Strain Interventions Intervention Not Indicated        Care Coordination Interventions Activated:  No  Care Coordination Interventions:  Yes, provided   Follow up plan: Follow up call scheduled for 10/16/21    Encounter Outcome:  Pt. Request to Call Back

## 2021-10-16 NOTE — Patient Outreach (Signed)
  Care Coordination   10/16/2021 Name: Patrick Cruz MRN: [id number] DOB: [birth year]   Care Coordination Outreach Attempts:  An unsuccessful telephone outreach was attempted today to offer the patient information about available care coordination services as a benefit of their health plan.   Follow Up Plan:  Additional outreach attempts will be made to offer the patient care coordination information and services.   Encounter Outcome:  No Answer  Care Coordination Interventions Activated:  No   Care Coordination Interventions:  No, not indicated    Eduard Clos MSW, LCSW Licensed Clinical Social Worker      [phone]

## 2021-10-19 ENCOUNTER — Ambulatory Visit: Payer: Self-pay | Admitting: *Deleted
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
## 2021-11-09 ENCOUNTER — Ambulatory Visit: Payer: Self-pay | Admitting: *Deleted

## 2021-11-09 DIAGNOSIS — F331 Major depressive disorder, recurrent, moderate: Secondary | ICD-10-CM
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
## 2021-11-11 ENCOUNTER — Ambulatory Visit: Payer: Self-pay | Admitting: Licensed Clinical Social Worker

## 2021-11-11 NOTE — Patient Outreach (Signed)
  Care Coordination   11/11/2021 Name: Patrick Cruz MRN: [id number] DOB: January 06, 1961   Care Coordination Outreach Attempts:  An unsuccessful telephone outreach was attempted today to offer the patient information about available care coordination services as a benefit of their health plan.   Follow Up Plan:  Additional outreach attempts will be made to offer the patient care coordination information and services.   Encounter Outcome:  No Answer  Care Coordination Interventions Activated:  No   Care Coordination Interventions:  No, not indicated    Lenor Derrick , MSW Social Worker [facility] Care Management  [phone]

## 2021-11-13 ENCOUNTER — Ambulatory Visit: Payer: Self-pay | Admitting: Licensed Clinical Social Worker

## 2021-11-13 NOTE — Patient Instructions (Signed)
Visit Information  Thank you for taking time to visit with me today. Please don't hesitate to contact me if I can be of assistance to you.   Following are the goals we discussed today:   Goals Addressed               This Visit's Progress     Care Coordination Activities- Housing (pt-stated)        Patient currently living in hotel and expected to leave in a couple of days. Patient advise he has no additional funds to stay. SW referred patient to [place]. SW emailed patient a list of shelters in the area. Patient denied wanting to go to a shelter. Patient denied suicidal ideations or self harming himself or others. Patient stated he has friends and support in [place]. Patient stated he has a son and a spouse.          If you are experiencing a Mental Health or [place] or need someone to talk to, please call the Suicide and Crisis Lifeline: 988 call 1-800-273-TALK (toll free, 24 hour hotline) go to [facility] [place] [phone]) call 911   Patient verbalizes understanding of instructions and care plan provided today and agrees to view in [place]. Active MyChart status and patient understanding of how to access instructions and care plan via MyChart confirmed with patient.     No further follow up required: .  Lenor Derrick, MSW  Social Worker [facility] Care Management  [phone]

## 2021-11-13 NOTE — Patient Outreach (Signed)
  Care Coordination   Initial Visit Note   11/13/2021 Name: Patrick Cruz MRN: [id number] DOB: 24-May-1960  Patrick Cruz is a 61 y.o. year old male who sees Pcp, No for primary care. I spoke with  Patrick Cruz by phone today.  What matters to the patients health and wellness today?  Housing    Goals Addressed               This [place] (pt-stated)        Patient currently living in hotel and expected to leave in a couple of days. Patient advise he has no additional funds to stay. SW referred patient to [facility]. SW emailed patient a list of shelters in the area. Patient denied wanting to go to a shelter. Patient denied suicidal ideations or self harming himself or others. Patient stated he has friends and support in [place]. Patient stated he has a son and a spouse.        SDOH assessments and interventions completed:  Yes  SDOH Interventions Today    Flowsheet Row Most Recent Value  SDOH Interventions   Housing Interventions Other (Comment)  [Refferred patient to several shelters and assistance agencies for housing.]        Care Coordination Interventions Activated:  Yes  Care Coordination Interventions:  Yes, provided   Follow up plan: No further intervention required.   Encounter Outcome:  Pt. Visit Completed
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
## 2021-11-23 ENCOUNTER — Ambulatory Visit: Payer: Self-pay | Admitting: *Deleted
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
## 2021-12-03 DIAGNOSIS — F3340 Major depressive disorder, recurrent, in remission, unspecified: Secondary | ICD-10-CM | POA: Diagnosis not present

## 2021-12-16 ENCOUNTER — Telehealth: Payer: Self-pay | Admitting: *Deleted

## 2021-12-16 ENCOUNTER — Encounter: Payer: Self-pay | Admitting: *Deleted

## 2021-12-16 NOTE — Patient Outreach (Signed)
  Care Coordination   12/16/2021 Name: Patrick Cruz MRN: [id number] DOB: 1961/01/27   Care Coordination Outreach Attempts:  An unsuccessful telephone outreach was attempted today to offer the patient information about available care coordination services as a benefit of their health plan.   Follow Up Plan:  Additional outreach attempts will be made to offer the patient care coordination information and services.   Encounter Outcome:  No Answer  Care Coordination Interventions Activated:  Yes   Care Coordination Interventions:  Yes, provided    Eduard Clos MSW, [place] Clinical Social Worker Care Coordination    [phone]

## 2021-12-17 ENCOUNTER — Telehealth: Payer: Self-pay | Admitting: *Deleted

## 2021-12-17 NOTE — Telephone Encounter (Signed)
  Care Coordination Note  12/17/2021 Name: Patrick Cruz MRN: [id number] DOB: May 13, 1960  Patrick Cruz is a 61 y.o. year old male who is a primary care patient of Pcp, No and is actively engaged with the care management team. I reached out to [name] by phone today to assist with scheduling a follow up visit with the Licensed Clinical Social Worker  Follow up plan: Unsuccessful telephone outreach attempt made. A HIPAA compliant phone message was left for the patient providing contact information and requesting a return call.   [place]  Direct Dial: [phone]

## 2021-12-17 NOTE — Telephone Encounter (Signed)
  Care Coordination Note  12/17/2021 Name: Patrick Cruz MRN: [id number] DOB: 12/13/1960  Patrick Cruz is a 61 y.o. year old male who is a primary care patient of Pcp, No and is actively engaged with the care management team. I reached out to [name] by phone today to assist with re-scheduling a follow up visit with the Licensed Clinical Social Worker  Follow up plan: Unsuccessful telephone outreach attempt made. A HIPAA compliant phone message was left for the patient providing contact information and requesting a return call.   [place]  Direct Dial: [phone]

## 2021-12-22 NOTE — Progress Notes (Signed)
  Care Coordination Note  12/22/2021 Name: Patrick Cruz MRN: [id number] DOB: 12/28/1960  Patrick Cruz is a 61 y.o. year old male who is a primary care patient of Pcp, No and is actively engaged with the care management team. I reached out to [name] by phone today to assist with re-scheduling a follow up visit with the Licensed Clinical Social Worker  Follow up plan: Unsuccessful telephone outreach attempt made.    [place]  Direct Dial: [phone]

## 2021-12-28 NOTE — Progress Notes (Signed)
  Care Coordination Note  12/28/2021 Name: Patrick Cruz MRN: [id number] DOB: December 27, 1960  [name] is a 61 y.o. year old male who is a primary care patient of Pcp, No and is actively engaged with the care management team. I reached out to [name] by phone today to assist with scheduling a follow up visit with the Licensed Clinical Social Worker  Follow up plan: Unsuccessful telephone outreach attempt made. A HIPAA compliant phone message was left for the patient providing contact information and requesting a return call.  We have been unable to make contact with the patient for follow up. The care management team is available to follow up with the patient after provider conversation with the patient regarding recommendation for care management engagement and subsequent re-referral to the care management team.   [place]  Direct Dial: [phone]

## 2022-04-01 DIAGNOSIS — F3340 Major depressive disorder, recurrent, in remission, unspecified: Secondary | ICD-10-CM | POA: Diagnosis not present

## 2022-04-01 DIAGNOSIS — Z79899 Other long term (current) drug therapy: Secondary | ICD-10-CM | POA: Diagnosis not present

## 2022-06-15 DIAGNOSIS — H21552 Recession of chamber angle, left eye: Secondary | ICD-10-CM | POA: Diagnosis not present

## 2022-06-15 DIAGNOSIS — H5203 Hypermetropia, bilateral: Secondary | ICD-10-CM | POA: Diagnosis not present

## 2022-06-15 DIAGNOSIS — H25093 Other age-related incipient cataract, bilateral: Secondary | ICD-10-CM | POA: Diagnosis not present

## 2022-06-15 DIAGNOSIS — H02824 Cysts of left upper eyelid: Secondary | ICD-10-CM | POA: Diagnosis not present

## 2022-06-15 DIAGNOSIS — H524 Presbyopia: Secondary | ICD-10-CM | POA: Diagnosis not present

## 2022-06-21 DIAGNOSIS — H524 Presbyopia: Secondary | ICD-10-CM | POA: Diagnosis not present

## 2022-06-21 DIAGNOSIS — H52223 Regular astigmatism, bilateral: Secondary | ICD-10-CM | POA: Diagnosis not present

## 2022-07-06 DIAGNOSIS — H21552 Recession of chamber angle, left eye: Secondary | ICD-10-CM | POA: Diagnosis not present

## 2022-07-14 ENCOUNTER — Emergency Department (HOSPITAL_COMMUNITY): Admission: EM | Admit: 2022-07-14 | Discharge: 2022-07-14 | Discharge status: Absent without leave | Payer: Medicare HMO

## 2022-07-14 DIAGNOSIS — H02824 Cysts of left upper eyelid: Secondary | ICD-10-CM | POA: Diagnosis not present

## 2022-07-22 DIAGNOSIS — F3340 Major depressive disorder, recurrent, in remission, unspecified: Secondary | ICD-10-CM | POA: Diagnosis not present

## 2022-08-09 DIAGNOSIS — H5203 Hypermetropia, bilateral: Secondary | ICD-10-CM | POA: Diagnosis not present

## 2022-10-20 DIAGNOSIS — F3340 Major depressive disorder, recurrent, in remission, unspecified: Secondary | ICD-10-CM | POA: Diagnosis not present

## 2022-11-30 ENCOUNTER — Other Ambulatory Visit: Payer: Self-pay

## 2022-11-30 ENCOUNTER — Emergency Department (HOSPITAL_COMMUNITY)
Admission: EM | Admit: 2022-11-30 | Discharge: 2022-11-30 | Disposition: A | Discharge status: Home / Self Care | Payer: Medicare HMO | Attending: Emergency Medicine | Admitting: Emergency Medicine

## 2022-11-30 ENCOUNTER — Encounter (HOSPITAL_COMMUNITY): Payer: Self-pay

## 2022-11-30 DIAGNOSIS — G8929 Other chronic pain: Secondary | ICD-10-CM | POA: Insufficient documentation

## 2022-11-30 DIAGNOSIS — R9431 Abnormal electrocardiogram [ECG] [EKG]: Secondary | ICD-10-CM | POA: Diagnosis not present

## 2022-11-30 DIAGNOSIS — I4510 Unspecified right bundle-branch block: Secondary | ICD-10-CM | POA: Insufficient documentation

## 2022-11-30 DIAGNOSIS — F419 Anxiety disorder, unspecified: Secondary | ICD-10-CM | POA: Insufficient documentation

## 2022-11-30 DIAGNOSIS — F410 Panic disorder [episodic paroxysmal anxiety] without agoraphobia: Secondary | ICD-10-CM | POA: Insufficient documentation

## 2022-11-30 DIAGNOSIS — Z79899 Other long term (current) drug therapy: Secondary | ICD-10-CM | POA: Diagnosis not present

## 2022-11-30 LAB — COMPREHENSIVE METABOLIC PANEL
ALT: 13 U/L (ref 0–44)
AST: 16 U/L (ref 15–41)
Albumin: 3.6 g/dL (ref 3.5–5.0)
Alkaline Phosphatase: 77 U/L (ref 38–126)
Anion gap: 8 (ref 5–15)
BUN: 20 mg/dL (ref 8–23)
CO2: 25 mmol/L (ref 22–32)
Calcium: 9 mg/dL (ref 8.9–10.3)
Chloride: 103 mmol/L (ref 98–111)
Creatinine, Ser: 1.12 mg/dL (ref 0.61–1.24)
GFR, Estimated: >60 mL/min (ref >60)
Glucose, Bld: 112 mg/dL — ABNORMAL HIGH (ref 70–99)
Potassium: 4 mmol/L (ref 3.5–5.1)
Sodium: 136 mmol/L (ref 135–145)
Total Bilirubin: 0.5 mg/dL (ref 0.3–1.2)
Total Protein: 7 g/dL (ref 6.5–8.1)

## 2022-11-30 LAB — CBC
HCT: 38.2 % — ABNORMAL LOW (ref 39.0–52.0)
Hemoglobin: 12.4 g/dL — ABNORMAL LOW (ref 13.0–17.0)
MCH: 29.3 pg (ref 26.0–34.0)
MCHC: 32.5 g/dL (ref 30.0–36.0)
MCV: 90.3 fL (ref 80.0–100.0)
Platelets: 297 10*3/uL (ref 150–400)
RBC: 4.23 MIL/uL (ref 4.22–5.81)
RDW: 12.3 % (ref 11.5–15.5)
WBC: 9.1 10*3/uL (ref 4.0–10.5)
nRBC: 0 % (ref 0.0–0.2)

## 2022-11-30 LAB — ACETAMINOPHEN LEVEL: Acetaminophen (Tylenol), Serum: <10 ug/mL — ABNORMAL LOW (ref 10–30)

## 2022-11-30 LAB — ETHANOL: Alcohol, Ethyl (B): <10 mg/dL (ref <10)

## 2022-11-30 LAB — SALICYLATE LEVEL: Salicylate Lvl: <7 mg/dL — ABNORMAL LOW (ref 7.0–30.0)
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
## 2022-11-30 NOTE — Discharge Instructions (Signed)
You are seen in the emergency department today for anxiety.  We do not have a reason to keep you since you are not having any thoughts of suicide, hoping to try or having thoughts about harming others.  If you start having worsening symptoms make sure you come back to the ER right away.  It is important for you to follow-up with your psychiatrist.
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
## 2022-12-01 ENCOUNTER — Telehealth: Payer: Self-pay | Admitting: *Deleted

## 2022-12-01 NOTE — Patient Outreach (Signed)
Late Entry-  CSW received phone call from pt on 11/30/22- stating he had my phone number from past encounters. Pt reports he feels like he is having a "panic attack". Denies SI; however, feels lost and alone. Pt indicated he was at [facility] in the parking lot and agreed to go into the Emergency Room for evaluation.  CSW left message for ED Case Manager to alert to this patient and potential needs/  CSW was able to see pt through [facility] records and that pt was in ED and being evaluated.  No further needs at this time for this CSW.   Reece Levy, MSW, LCSW [place]  [facility], [facility] Licensed Clinical Social Worker Care Coordinator  [phone]

## 2023-01-18 DIAGNOSIS — F3340 Major depressive disorder, recurrent, in remission, unspecified: Secondary | ICD-10-CM | POA: Diagnosis not present

## 2023-02-15 DIAGNOSIS — F3340 Major depressive disorder, recurrent, in remission, unspecified: Secondary | ICD-10-CM | POA: Diagnosis not present

## 2023-05-16 DIAGNOSIS — F3340 Major depressive disorder, recurrent, in remission, unspecified: Secondary | ICD-10-CM | POA: Diagnosis not present
